# Patient Record
Sex: Female | Born: 1995 | Race: White | Hispanic: No | Marital: Single | Smoking: Former smoker
Health system: Southern US, Community
[De-identification: ages and names within clinical notes are randomized; demographics above are authoritative.]

## PROBLEM LIST (undated history)

## (undated) DIAGNOSIS — F419 Anxiety disorder, unspecified: Secondary | ICD-10-CM

## (undated) DIAGNOSIS — F329 Major depressive disorder, single episode, unspecified: Secondary | ICD-10-CM

## (undated) DIAGNOSIS — F32A Depression, unspecified: Secondary | ICD-10-CM

## (undated) DIAGNOSIS — F988 Other specified behavioral and emotional disorders with onset usually occurring in childhood and adolescence: Secondary | ICD-10-CM

## (undated) DIAGNOSIS — F1020 Alcohol dependence, uncomplicated: Secondary | ICD-10-CM

## (undated) HISTORY — DX: Other specified behavioral and emotional disorders with onset usually occurring in childhood and adolescence: F98.8

## (undated) HISTORY — PX: WISDOM TOOTH EXTRACTION: SHX21

## (undated) HISTORY — DX: Alcohol dependence, uncomplicated: F10.20

## (undated) HISTORY — DX: Depression, unspecified: F32.A

## (undated) HISTORY — DX: Anxiety disorder, unspecified: F41.9

---
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
## 2016-12-27 DIAGNOSIS — F331 Major depressive disorder, recurrent, moderate: Secondary | ICD-10-CM | POA: Diagnosis not present

## 2017-02-21 DIAGNOSIS — F331 Major depressive disorder, recurrent, moderate: Secondary | ICD-10-CM | POA: Diagnosis not present

## 2017-03-07 DIAGNOSIS — F331 Major depressive disorder, recurrent, moderate: Secondary | ICD-10-CM | POA: Diagnosis not present

## 2017-03-21 DIAGNOSIS — F331 Major depressive disorder, recurrent, moderate: Secondary | ICD-10-CM | POA: Diagnosis not present

## 2017-04-04 DIAGNOSIS — F331 Major depressive disorder, recurrent, moderate: Secondary | ICD-10-CM | POA: Diagnosis not present

## 2017-04-18 DIAGNOSIS — F331 Major depressive disorder, recurrent, moderate: Secondary | ICD-10-CM | POA: Diagnosis not present

## 2017-05-03 DIAGNOSIS — F331 Major depressive disorder, recurrent, moderate: Secondary | ICD-10-CM | POA: Diagnosis not present

## 2017-05-16 DIAGNOSIS — F331 Major depressive disorder, recurrent, moderate: Secondary | ICD-10-CM | POA: Diagnosis not present

## 2017-06-27 DIAGNOSIS — F331 Major depressive disorder, recurrent, moderate: Secondary | ICD-10-CM | POA: Diagnosis not present

## 2017-07-11 DIAGNOSIS — F331 Major depressive disorder, recurrent, moderate: Secondary | ICD-10-CM | POA: Diagnosis not present

## 2017-07-11 DIAGNOSIS — F419 Anxiety disorder, unspecified: Secondary | ICD-10-CM | POA: Diagnosis not present

## 2017-07-11 DIAGNOSIS — F3281 Premenstrual dysphoric disorder: Secondary | ICD-10-CM | POA: Diagnosis not present

## 2017-07-18 DIAGNOSIS — F331 Major depressive disorder, recurrent, moderate: Secondary | ICD-10-CM | POA: Diagnosis not present

## 2017-07-25 DIAGNOSIS — F419 Anxiety disorder, unspecified: Secondary | ICD-10-CM | POA: Diagnosis not present

## 2017-08-01 DIAGNOSIS — F419 Anxiety disorder, unspecified: Secondary | ICD-10-CM | POA: Diagnosis not present

## 2017-08-08 DIAGNOSIS — F419 Anxiety disorder, unspecified: Secondary | ICD-10-CM | POA: Diagnosis not present

## 2017-08-15 DIAGNOSIS — F419 Anxiety disorder, unspecified: Secondary | ICD-10-CM | POA: Diagnosis not present

## 2017-08-29 DIAGNOSIS — F419 Anxiety disorder, unspecified: Secondary | ICD-10-CM | POA: Diagnosis not present

## 2017-09-05 DIAGNOSIS — F419 Anxiety disorder, unspecified: Secondary | ICD-10-CM | POA: Diagnosis not present

## 2017-09-26 DIAGNOSIS — F419 Anxiety disorder, unspecified: Secondary | ICD-10-CM | POA: Diagnosis not present

## 2017-10-30 DIAGNOSIS — F3281 Premenstrual dysphoric disorder: Secondary | ICD-10-CM | POA: Diagnosis not present

## 2017-10-30 DIAGNOSIS — F331 Major depressive disorder, recurrent, moderate: Secondary | ICD-10-CM | POA: Diagnosis not present

## 2017-10-30 DIAGNOSIS — G478 Other sleep disorders: Secondary | ICD-10-CM | POA: Diagnosis not present

## 2017-10-31 DIAGNOSIS — F419 Anxiety disorder, unspecified: Secondary | ICD-10-CM | POA: Diagnosis not present

## 2018-04-24 DIAGNOSIS — F3281 Premenstrual dysphoric disorder: Secondary | ICD-10-CM | POA: Diagnosis not present

## 2018-04-24 DIAGNOSIS — F331 Major depressive disorder, recurrent, moderate: Secondary | ICD-10-CM | POA: Diagnosis not present

## 2018-04-24 DIAGNOSIS — G4700 Insomnia, unspecified: Secondary | ICD-10-CM | POA: Diagnosis not present

## 2018-09-11 DIAGNOSIS — F331 Major depressive disorder, recurrent, moderate: Secondary | ICD-10-CM | POA: Diagnosis not present

## 2018-10-07 DIAGNOSIS — F331 Major depressive disorder, recurrent, moderate: Secondary | ICD-10-CM | POA: Diagnosis not present

## 2018-10-22 DIAGNOSIS — F331 Major depressive disorder, recurrent, moderate: Secondary | ICD-10-CM | POA: Diagnosis not present

## 2018-11-03 DIAGNOSIS — F331 Major depressive disorder, recurrent, moderate: Secondary | ICD-10-CM | POA: Diagnosis not present

## 2018-11-05 DIAGNOSIS — F331 Major depressive disorder, recurrent, moderate: Secondary | ICD-10-CM | POA: Diagnosis not present

## 2018-11-12 DIAGNOSIS — F331 Major depressive disorder, recurrent, moderate: Secondary | ICD-10-CM | POA: Diagnosis not present

## 2018-11-17 DIAGNOSIS — F331 Major depressive disorder, recurrent, moderate: Secondary | ICD-10-CM | POA: Diagnosis not present

## 2018-11-20 DIAGNOSIS — F331 Major depressive disorder, recurrent, moderate: Secondary | ICD-10-CM | POA: Diagnosis not present

## 2018-11-20 NOTE — Progress Notes (Signed)
Subjective:    Patient ID: Rhonda Benton, female    DOB: 24-Oct-1995, 23 y.o.   MRN: [id number]  HPI  She is here to establish with a new pcp.  She is here for a physical exam.   She recently moved back to the area and is currently living with her parents.  She moved after breaking up with her boyfriend.  She is experiencing significant anxiety and depression.  She is following with a therapist and psychiatrist.  Her medication is being adjusted and her Effexor dose was recently increased.  She is experiencing headaches.  She experienced headaches when the medication was first started, but they improved and the medication was increased again a couple of days ago and she is having headaches.  Ibuprofen does help.  She is trying to determine what she is going to do with her life at this point.  Her mother, maternal aunt and maternal uncle all have bicuspid aortic valve.  She has not been evaluated for this and feels she should get evaluated for it.     Medications and allergies reviewed with patient and updated if appropriate.  Patient Active Problem List   Diagnosis Date Noted  . KNEE PAIN, BILATERAL 05/20/2007  . PES PLANUS 05/20/2007    Current Outpatient Medications on File Prior to Visit  Medication Sig Dispense Refill  . clonazePAM (KLONOPIN) 0.5 MG tablet Take 1 tablet by mouth as needed.     No current facility-administered medications on file prior to visit.     Past Medical History:  Diagnosis Date  . Anxiety   . Depression     Past Surgical History:  Procedure Laterality Date  . WISDOM TOOTH EXTRACTION      Social History   Socioeconomic History  . Marital status: Single    Spouse name: Not on file  . Number of children: Not on file  . Years of education: Not on file  . Highest education level: Not on file  Occupational History  . Not on file  Social Needs  . Financial resource strain: Not on file  . Food insecurity    Worry: Not on file   Inability: Not on file  . Transportation needs    Medical: Not on file    Non-medical: Not on file  Tobacco Use  . Smoking status: Former Smoker    Types: Cigarettes  . Smokeless tobacco: Never Used  Substance and Sexual Activity  . Alcohol use: Yes  . Drug use: Yes    Types: Marijuana  . Sexual activity: Not on file  Lifestyle  . Physical activity    Days per week: Not on file    Minutes per session: Not on file  . Stress: Not on file  Relationships  . Social Musician on phone: Not on file    Gets together: Not on file    Attends religious service: Not on file    Active member of club or organization: Not on file    Attends meetings of clubs or organizations: Not on file    Relationship status: Not on file  Other Topics Concern  . Not on file  Social History Narrative  . Not on file    Family History  Problem Relation Age of Onset  . Diabetes Other   . High Cholesterol Mother   . Crohn's disease Mother   . Prostate cancer Father   . Colitis Brother   . Breast cancer Maternal Grandmother   .  Kidney disease Maternal Grandfather   . Skin cancer Maternal Grandfather   . Hearing loss Paternal Grandmother   . Heart attack Paternal Grandmother   . Prostate cancer Paternal Grandfather   . Diabetes Paternal Grandfather   . Hearing loss Paternal Grandfather   . Heart disease Maternal Aunt   . Heart disease Maternal Uncle     Review of Systems  Constitutional: Negative for chills and fever.  Eyes: Negative for visual disturbance.  Respiratory: Positive for cough (allergy related). Negative for shortness of breath and wheezing.   Cardiovascular: Positive for palpitations (anxiety related). Negative for chest pain.  Gastrointestinal: Positive for constipation (mild) and nausea (anxiety related). Negative for abdominal pain, blood in stool and diarrhea.       Rare gerd  Genitourinary: Negative for dysuria and hematuria.  Musculoskeletal: Positive for back  pain (l lower back to l hip). Negative for arthralgias.  Skin: Negative for color change and rash.  Neurological: Positive for headaches (with effexor). Negative for dizziness and light-headedness.  Psychiatric/Behavioral: Positive for dysphoric mood and sleep disturbance. The patient is nervous/anxious.        Objective:   Vitals:   11/21/18 1353  BP: 140/80  Pulse: 91  Temp: 98.3 F (36.8 C)  SpO2: 98%   Filed Weights   11/21/18 1353  Weight: 155 lb (70.3 kg)   Body mass index is 26.19 kg/m.  BP Readings from Last 3 Encounters:  11/21/18 140/80  10/11/11 (!) 98/54    Wt Readings from Last 3 Encounters:  11/21/18 155 lb (70.3 kg)     Physical Exam Constitutional: She appears well-developed and well-nourished. No distress.  HENT:  Head: Normocephalic and atraumatic.  Right Ear: External ear normal. Normal ear canal and TM Left Ear: External ear normal.  Normal ear canal and TM Mouth/Throat: Oropharynx is clear and moist.  Eyes: Conjunctivae and EOM are normal.  Neck: Neck supple. No tracheal deviation present. No thyromegaly present.  No carotid bruit  Cardiovascular: Normal rate, regular rhythm and normal heart sounds.   No murmur heard.  No edema. Pulmonary/Chest: Effort normal and breath sounds normal. No respiratory distress. She has no wheezes. She has no rales.  Breast: deferred   Abdominal: Soft. She exhibits no distension. There is no tenderness.  Lymphadenopathy: She has no cervical adenopathy.  Skin: Skin is warm and dry. She is not diaphoretic.  Psychiatric: She has a depressed and anxious mood and affect. Her behavior is normal.        Assessment & Plan:   Physical exam: Screening blood work    ordered Immunizations  Flu vaccine today, tdap probably up to date Gyn   Due - will schedule Exercise  Minimal - but active Weight  Normal BMI Substance abuse   none  See Problem List for Assessment and Plan of chronic medical problems.  Follow-up  in 1 year, sooner if needed

## 2018-11-21 ENCOUNTER — Other Ambulatory Visit (INDEPENDENT_AMBULATORY_CARE_PROVIDER_SITE_OTHER): Payer: BC Managed Care – PPO

## 2018-11-21 ENCOUNTER — Encounter: Payer: Self-pay | Admitting: Internal Medicine

## 2018-11-21 ENCOUNTER — Ambulatory Visit (INDEPENDENT_AMBULATORY_CARE_PROVIDER_SITE_OTHER): Payer: BC Managed Care – PPO | Admitting: Internal Medicine

## 2018-11-21 ENCOUNTER — Other Ambulatory Visit: Payer: Self-pay

## 2018-11-21 VITALS — BP 140/80 | HR 91 | Temp 98.3°F | Ht 64.5 in | Wt 155.0 lb

## 2018-11-21 DIAGNOSIS — Z8279 Family history of other congenital malformations, deformations and chromosomal abnormalities: Secondary | ICD-10-CM | POA: Diagnosis not present

## 2018-11-21 DIAGNOSIS — Z23 Encounter for immunization: Secondary | ICD-10-CM

## 2018-11-21 DIAGNOSIS — Z0000 Encounter for general adult medical examination without abnormal findings: Secondary | ICD-10-CM | POA: Diagnosis not present

## 2018-11-21 DIAGNOSIS — F4323 Adjustment disorder with mixed anxiety and depressed mood: Secondary | ICD-10-CM

## 2018-11-21 LAB — COMPREHENSIVE METABOLIC PANEL
ALT: 15 U/L (ref 0–35)
AST: 16 U/L (ref 0–37)
Albumin: 4.7 g/dL (ref 3.5–5.2)
Alkaline Phosphatase: 71 U/L (ref 39–117)
BUN: 9 mg/dL (ref 6–23)
CO2: 26 mEq/L (ref 19–32)
Calcium: 10.1 mg/dL (ref 8.4–10.5)
Chloride: 99 mEq/L (ref 96–112)
Creatinine, Ser: 0.68 mg/dL (ref 0.40–1.20)
GFR: 105.16 mL/min (ref >60.00)
Glucose, Bld: 96 mg/dL (ref 70–99)
Potassium: 3.6 mEq/L (ref 3.5–5.1)
Sodium: 137 mEq/L (ref 135–145)
Total Bilirubin: 0.5 mg/dL (ref 0.2–1.2)
Total Protein: 8.1 g/dL (ref 6.0–8.3)

## 2018-11-21 LAB — CBC WITH DIFFERENTIAL/PLATELET
Basophils Absolute: 0 10*3/uL (ref 0.0–0.1)
Basophils Relative: 0.3 % (ref 0.0–3.0)
Eosinophils Absolute: 0 10*3/uL (ref 0.0–0.7)
Eosinophils Relative: 0.4 % (ref 0.0–5.0)
HCT: 40.2 % (ref 36.0–46.0)
Hemoglobin: 13.6 g/dL (ref 12.0–15.0)
Lymphocytes Relative: 17.4 % (ref 12.0–46.0)
Lymphs Abs: 1.3 10*3/uL (ref 0.7–4.0)
MCHC: 33.9 g/dL (ref 30.0–36.0)
MCV: 84.1 fl (ref 78.0–100.0)
Monocytes Absolute: 0.7 10*3/uL (ref 0.1–1.0)
Monocytes Relative: 9 % (ref 3.0–12.0)
Neutro Abs: 5.6 10*3/uL (ref 1.4–7.7)
Neutrophils Relative %: 72.9 % (ref 43.0–77.0)
Platelets: 226 10*3/uL (ref 150.0–400.0)
RBC: 4.78 Mil/uL (ref 3.87–5.11)
RDW: 14.3 % (ref 11.5–15.5)
WBC: 7.7 10*3/uL (ref 4.0–10.5)

## 2018-11-21 LAB — LIPID PANEL
Cholesterol: 188 mg/dL (ref 0–200)
HDL: 64 mg/dL (ref >39.00)
LDL Cholesterol: 104 mg/dL — ABNORMAL HIGH (ref 0–99)
NonHDL: 124.43
Total CHOL/HDL Ratio: 3
Triglycerides: 103 mg/dL (ref 0.0–149.0)
VLDL: 20.6 mg/dL (ref 0.0–40.0)

## 2018-11-21 LAB — TSH: TSH: 1.66 u[IU]/mL (ref 0.35–4.50)

## 2018-11-21 MED ORDER — VENLAFAXINE HCL ER 150 MG PO CP24: 150.0000 mg | ORAL_CAPSULE | Freq: Every day | ORAL | Status: AC

## 2018-11-21 NOTE — Patient Instructions (Addendum)
[facility] [address] [place], [place] [address] [phone]   [facility] [address] [place], [place] Across the street from [facility] [phone]    Tests ordered today. Your results will be released to [place] (or called to you) after review.  If any changes need to be made, you will be notified at that same time.  All other Health Maintenance issues reviewed.   All recommended immunizations and age-appropriate screenings are up-to-date or discussed.  Flu immunization administered today.  .   Medications reviewed and updated.  Changes include :   none    [facility] [place]  [facility] [place]  [place], [place]  [facility] [place] [facility] [place], [place], [place], [phone] Office [phone] [facility]            [place], [place] Maintenance, Female Adopting a healthy lifestyle and getting preventive care are important in promoting health and wellness. Ask your health care provider about:  The right schedule for you to have regular tests and exams.  Things you can do on your own to prevent diseases and keep yourself healthy. What should I know about diet, weight, and exercise? Eat a healthy diet   Eat a diet that includes plenty of vegetables, fruits, low-fat dairy products, and lean protein.  Do not eat a lot of foods that are high in solid fats, added sugars, or sodium. Maintain a healthy weight Body mass index (BMI) is used to identify weight problems. It estimates body fat  based on height and weight. Your health care provider can help determine your BMI and help you achieve or maintain a healthy weight. Get regular exercise Get regular exercise. This is one of the most important things you can do for your health. Most adults should:  Exercise for at least 150 minutes each week. The exercise should increase your heart rate and make you sweat (moderate-intensity exercise).  Do strengthening exercises at least twice a week. This is in addition to the moderate-intensity exercise.  Spend less time sitting. Even light physical activity can be beneficial. Watch cholesterol and blood lipids Have your blood tested for lipids and cholesterol at 25 years of age, then have this test every 5 years. Have your cholesterol levels checked more often if:  Your lipid or cholesterol levels are high.  You are older than 25 years of age.  You are at high risk for heart disease. What should I know about cancer screening? Depending on your health history and family history, you may need to have cancer screening at various ages. This may include screening for:  Breast cancer.  Cervical cancer.  Colorectal cancer.  Skin cancer.  Lung cancer. What should I know about heart disease, diabetes, and high blood pressure? Blood pressure and heart disease  High blood pressure causes heart disease and increases the risk of stroke. This is more likely to develop in people who have high blood pressure readings, are of African descent, or are overweight.  Have your blood pressure checked: ? Every 3-5 years if you are 5-27 years of age. ? Every year if you are 60  years old or older. Diabetes Have regular diabetes screenings. This checks your fasting blood sugar level. Have the screening done:  Once every three years after age 57 if you are at a normal weight and have a low risk for diabetes.  More often and at a younger age if you are overweight or have a high risk for diabetes.  What should I know about preventing infection? Hepatitis B If you have a higher risk for hepatitis B, you should be screened for this virus. Talk with your health care provider to find out if you are at risk for hepatitis B infection. Hepatitis C Testing is recommended for:  Everyone born from [birth year] through 1967.  Anyone with known risk factors for hepatitis C. Sexually transmitted infections (STIs)  Get screened for STIs, including gonorrhea and chlamydia, if: ? You are sexually active and are younger than 25 years of age. ? You are older than 25 years of age and your health care provider tells you that you are at risk for this type of infection. ? Your sexual activity has changed since you were last screened, and you are at increased risk for chlamydia or gonorrhea. Ask your health care provider if you are at risk.  Ask your health care provider about whether you are at high risk for HIV. Your health care provider may recommend a prescription medicine to help prevent HIV infection. If you choose to take medicine to prevent HIV, you should first get tested for HIV. You should then be tested every 3 months for as long as you are taking the medicine. Pregnancy  If you are about to stop having your period (premenopausal) and you may become pregnant, seek counseling before you get pregnant.  Take 400 to 800 micrograms (mcg) of folic acid every day if you become pregnant.  Ask for birth control (contraception) if you want to prevent pregnancy. Osteoporosis and menopause Osteoporosis is a disease in which the bones lose minerals and strength with aging. This can result in bone fractures. If you are 1 years old or older, or if you are at risk for osteoporosis and fractures, ask your health care provider if you should:  Be screened for bone loss.  Take a calcium or vitamin D supplement to lower your risk of fractures.  Be given hormone replacement therapy (HRT) to treat symptoms of  menopause. Follow these instructions at home: Lifestyle  Do not use any products that contain nicotine or tobacco, such as cigarettes, e-cigarettes, and chewing tobacco. If you need help quitting, ask your health care provider.  Do not use street drugs.  Do not share needles.  Ask your health care provider for help if you need support or information about quitting drugs. Alcohol use  Do not drink alcohol if: ? Your health care provider tells you not to drink. ? You are pregnant, may be pregnant, or are planning to become pregnant.  If you drink alcohol: ? Limit how much you use to 0-1 drink a day. ? Limit intake if you are breastfeeding.  Be aware of how much alcohol is in your drink. In the [place] one drink equals one 12 oz bottle of beer (355 mL), one 5 oz glass of wine (148 mL), or one 1 oz glass of hard liquor (44 mL). General instructions  Schedule regular health, dental, and eye exams.  Stay current with your vaccines.  Tell your health care provider if: ? You often feel depressed. ? You have ever been abused or  do not feel safe at home. Summary  Adopting a healthy lifestyle and getting preventive care are important in promoting health and wellness.  Follow your health care provider's instructions about healthy diet, exercising, and getting tested or screened for diseases.  Follow your health care provider's instructions on monitoring your cholesterol and blood pressure. This information is not intended to replace advice given to you by your health care provider. Make sure you discuss any questions you have with your health care provider. Document Released: 08/21/2010 Document Revised: 01/29/2018 Document Reviewed: 01/29/2018 Elsevier Patient Education  2020 [employer].

## 2018-11-21 NOTE — Assessment & Plan Note (Signed)
Significant family history of bicuspid aortic valves, mom has had valve replacement because of this We will order an echocardiogram to evaluate valve

## 2018-11-21 NOTE — Assessment & Plan Note (Signed)
Related to a recent break-up Following with psychiatry-taking Effexor, clonazepam Also seeing a therapist, but may try to find a new one since she has not connected with her current therapist

## 2018-11-22 ENCOUNTER — Encounter: Payer: Self-pay | Admitting: Internal Medicine

## 2018-11-28 ENCOUNTER — Encounter (HOSPITAL_COMMUNITY): Payer: Self-pay | Admitting: Internal Medicine

## 2018-11-29 DIAGNOSIS — F331 Major depressive disorder, recurrent, moderate: Secondary | ICD-10-CM | POA: Diagnosis not present

## 2018-12-09 DIAGNOSIS — F331 Major depressive disorder, recurrent, moderate: Secondary | ICD-10-CM | POA: Diagnosis not present

## 2018-12-11 ENCOUNTER — Telehealth (HOSPITAL_COMMUNITY): Payer: Self-pay

## 2018-12-11 NOTE — Telephone Encounter (Signed)
New message   Just an FYI. We have made several attempts to contact this patient including sending a letter to schedule or reschedule their echocardiogram. We will be removing the patient from the echo WQ.   10.22.20 @ 2:35pm lm on home vm - Rhonda Benton  10.9.20 mail reminder letter Rhonda Benton  10.6.20 2 12:24pm lm on home vm  - Rhonda Benton

## 2018-12-15 DIAGNOSIS — F331 Major depressive disorder, recurrent, moderate: Secondary | ICD-10-CM | POA: Diagnosis not present

## 2018-12-18 DIAGNOSIS — F331 Major depressive disorder, recurrent, moderate: Secondary | ICD-10-CM | POA: Diagnosis not present

## 2018-12-23 DIAGNOSIS — Z0100 Encounter for examination of eyes and vision without abnormal findings: Secondary | ICD-10-CM | POA: Diagnosis not present

## 2018-12-24 DIAGNOSIS — F331 Major depressive disorder, recurrent, moderate: Secondary | ICD-10-CM | POA: Diagnosis not present

## 2018-12-25 DIAGNOSIS — L700 Acne vulgaris: Secondary | ICD-10-CM | POA: Diagnosis not present

## 2018-12-29 ENCOUNTER — Other Ambulatory Visit (HOSPITAL_COMMUNITY): Payer: BC Managed Care – PPO

## 2018-12-29 ENCOUNTER — Encounter: Payer: Self-pay | Admitting: Internal Medicine

## 2018-12-29 DIAGNOSIS — J029 Acute pharyngitis, unspecified: Secondary | ICD-10-CM | POA: Diagnosis not present

## 2018-12-29 DIAGNOSIS — J020 Streptococcal pharyngitis: Secondary | ICD-10-CM | POA: Diagnosis not present

## 2018-12-29 DIAGNOSIS — Z299 Encounter for prophylactic measures, unspecified: Secondary | ICD-10-CM | POA: Diagnosis not present

## 2019-01-12 ENCOUNTER — Telehealth (HOSPITAL_COMMUNITY): Payer: Self-pay

## 2019-01-12 ENCOUNTER — Other Ambulatory Visit (HOSPITAL_COMMUNITY): Payer: BC Managed Care – PPO

## 2019-01-22 DIAGNOSIS — F331 Major depressive disorder, recurrent, moderate: Secondary | ICD-10-CM | POA: Diagnosis not present

## 2019-01-28 DIAGNOSIS — F331 Major depressive disorder, recurrent, moderate: Secondary | ICD-10-CM | POA: Diagnosis not present

## 2019-01-29 DIAGNOSIS — H5231 Anisometropia: Secondary | ICD-10-CM | POA: Diagnosis not present

## 2019-01-29 DIAGNOSIS — H5702 Anisocoria: Secondary | ICD-10-CM | POA: Diagnosis not present

## 2019-01-29 DIAGNOSIS — H538 Other visual disturbances: Secondary | ICD-10-CM | POA: Diagnosis not present

## 2019-02-04 DIAGNOSIS — F331 Major depressive disorder, recurrent, moderate: Secondary | ICD-10-CM | POA: Diagnosis not present

## 2019-02-05 DIAGNOSIS — Z6826 Body mass index (BMI) 26.0-26.9, adult: Secondary | ICD-10-CM | POA: Diagnosis not present

## 2019-02-05 DIAGNOSIS — Z130 Encounter for screening for diseases of the blood and blood-forming organs and certain disorders involving the immune mechanism: Secondary | ICD-10-CM | POA: Diagnosis not present

## 2019-02-05 DIAGNOSIS — Z01419 Encounter for gynecological examination (general) (routine) without abnormal findings: Secondary | ICD-10-CM | POA: Diagnosis not present

## 2019-02-05 DIAGNOSIS — Z113 Encounter for screening for infections with a predominantly sexual mode of transmission: Secondary | ICD-10-CM | POA: Diagnosis not present

## 2019-02-05 DIAGNOSIS — Z124 Encounter for screening for malignant neoplasm of cervix: Secondary | ICD-10-CM | POA: Diagnosis not present

## 2019-02-10 DIAGNOSIS — F331 Major depressive disorder, recurrent, moderate: Secondary | ICD-10-CM | POA: Diagnosis not present

## 2019-02-11 NOTE — Telephone Encounter (Signed)
err

## 2019-02-17 DIAGNOSIS — F331 Major depressive disorder, recurrent, moderate: Secondary | ICD-10-CM | POA: Diagnosis not present

## 2019-02-25 DIAGNOSIS — F331 Major depressive disorder, recurrent, moderate: Secondary | ICD-10-CM | POA: Diagnosis not present

## 2019-03-06 DIAGNOSIS — F331 Major depressive disorder, recurrent, moderate: Secondary | ICD-10-CM | POA: Diagnosis not present

## 2019-03-12 DIAGNOSIS — F331 Major depressive disorder, recurrent, moderate: Secondary | ICD-10-CM | POA: Diagnosis not present

## 2019-03-19 DIAGNOSIS — F331 Major depressive disorder, recurrent, moderate: Secondary | ICD-10-CM | POA: Diagnosis not present

## 2019-03-25 DIAGNOSIS — F331 Major depressive disorder, recurrent, moderate: Secondary | ICD-10-CM | POA: Diagnosis not present

## 2019-04-03 DIAGNOSIS — F331 Major depressive disorder, recurrent, moderate: Secondary | ICD-10-CM | POA: Diagnosis not present

## 2019-04-08 DIAGNOSIS — F331 Major depressive disorder, recurrent, moderate: Secondary | ICD-10-CM | POA: Diagnosis not present

## 2019-04-30 DIAGNOSIS — F331 Major depressive disorder, recurrent, moderate: Secondary | ICD-10-CM | POA: Diagnosis not present

## 2019-05-07 DIAGNOSIS — F331 Major depressive disorder, recurrent, moderate: Secondary | ICD-10-CM | POA: Diagnosis not present

## 2019-05-14 DIAGNOSIS — F331 Major depressive disorder, recurrent, moderate: Secondary | ICD-10-CM | POA: Diagnosis not present

## 2019-05-20 DIAGNOSIS — F331 Major depressive disorder, recurrent, moderate: Secondary | ICD-10-CM | POA: Diagnosis not present

## 2019-06-09 DIAGNOSIS — F331 Major depressive disorder, recurrent, moderate: Secondary | ICD-10-CM | POA: Diagnosis not present

## 2019-06-18 DIAGNOSIS — F331 Major depressive disorder, recurrent, moderate: Secondary | ICD-10-CM | POA: Diagnosis not present

## 2019-06-22 DIAGNOSIS — F331 Major depressive disorder, recurrent, moderate: Secondary | ICD-10-CM | POA: Diagnosis not present

## 2019-11-03 DIAGNOSIS — F411 Generalized anxiety disorder: Secondary | ICD-10-CM | POA: Diagnosis not present

## 2019-11-05 ENCOUNTER — Telehealth (HOSPITAL_COMMUNITY): Payer: Self-pay | Admitting: Licensed Clinical Social Worker

## 2019-11-09 DIAGNOSIS — F331 Major depressive disorder, recurrent, moderate: Secondary | ICD-10-CM | POA: Diagnosis not present

## 2019-11-09 DIAGNOSIS — F413 Other mixed anxiety disorders: Secondary | ICD-10-CM | POA: Diagnosis not present

## 2019-11-09 NOTE — Telephone Encounter (Signed)
See phone call logs. Attempted outreach for CDIOP unsuccessful

## 2019-11-14 DIAGNOSIS — F411 Generalized anxiety disorder: Secondary | ICD-10-CM | POA: Diagnosis not present

## 2019-11-24 DIAGNOSIS — F411 Generalized anxiety disorder: Secondary | ICD-10-CM | POA: Diagnosis not present

## 2020-04-13 DIAGNOSIS — N760 Acute vaginitis: Secondary | ICD-10-CM | POA: Diagnosis not present

## 2020-04-13 DIAGNOSIS — Z130 Encounter for screening for diseases of the blood and blood-forming organs and certain disorders involving the immune mechanism: Secondary | ICD-10-CM | POA: Diagnosis not present

## 2020-04-13 DIAGNOSIS — N898 Other specified noninflammatory disorders of vagina: Secondary | ICD-10-CM | POA: Diagnosis not present

## 2020-04-13 DIAGNOSIS — Z01419 Encounter for gynecological examination (general) (routine) without abnormal findings: Secondary | ICD-10-CM | POA: Diagnosis not present

## 2020-04-13 DIAGNOSIS — Z6824 Body mass index (BMI) 24.0-24.9, adult: Secondary | ICD-10-CM | POA: Diagnosis not present

## 2020-04-19 ENCOUNTER — Other Ambulatory Visit: Payer: Self-pay

## 2020-04-19 NOTE — Patient Instructions (Signed)
Blood work was ordered.     No immunization administered today.   Medications changes include :   none   An Echo was ordered.  Someone will call you to schedule an appointment.    Please followup in 1 year     Health Maintenance, Female Adopting a healthy lifestyle and getting preventive care are important in promoting health and wellness. Ask your health care provider about:  The right schedule for you to have regular tests and exams.  Things you can do on your own to prevent diseases and keep yourself healthy. What should I know about diet, weight, and exercise? Eat a healthy diet  Eat a diet that includes plenty of vegetables, fruits, low-fat dairy products, and lean protein.  Do not eat a lot of foods that are high in solid fats, added sugars, or sodium.   Maintain a healthy weight Body mass index (BMI) is used to identify weight problems. It estimates body fat based on height and weight. Your health care provider can help determine your BMI and help you achieve or maintain a healthy weight. Get regular exercise Get regular exercise. This is one of the most important things you can do for your health. Most adults should:  Exercise for at least 150 minutes each week. The exercise should increase your heart rate and make you sweat (moderate-intensity exercise).  Do strengthening exercises at least twice a week. This is in addition to the moderate-intensity exercise.  Spend less time sitting. Even light physical activity can be beneficial. Watch cholesterol and blood lipids Have your blood tested for lipids and cholesterol at 27 years of age, then have this test every 5 years. Have your cholesterol levels checked more often if:  Your lipid or cholesterol levels are high.  You are older than 27 years of age.  You are at high risk for heart disease. What should I know about cancer screening? Depending on your health history and family history, you may need to have  cancer screening at various ages. This may include screening for:  Breast cancer.  Cervical cancer.  Colorectal cancer.  Skin cancer.  Lung cancer. What should I know about heart disease, diabetes, and high blood pressure? Blood pressure and heart disease  High blood pressure causes heart disease and increases the risk of stroke. This is more likely to develop in people who have high blood pressure readings, are of African descent, or are overweight.  Have your blood pressure checked: ? Every 3-5 years if you are 13-66 years of age. ? Every year if you are 35 years old or older. Diabetes Have regular diabetes screenings. This checks your fasting blood sugar level. Have the screening done:  Once every three years after age 34 if you are at a normal weight and have a low risk for diabetes.  More often and at a younger age if you are overweight or have a high risk for diabetes. What should I know about preventing infection? Hepatitis B If you have a higher risk for hepatitis B, you should be screened for this virus. Talk with your health care provider to find out if you are at risk for hepatitis B infection. Hepatitis C Testing is recommended for:  Everyone born from [birth year] through 1967.  Anyone with known risk factors for hepatitis C. Sexually transmitted infections (STIs)  Get screened for STIs, including gonorrhea and chlamydia, if: ? You are sexually active and are younger than 27 years of age. ? You are older  than 27 years of age and your health care provider tells you that you are at risk for this type of infection. ? Your sexual activity has changed since you were last screened, and you are at increased risk for chlamydia or gonorrhea. Ask your health care provider if you are at risk.  Ask your health care provider about whether you are at high risk for HIV. Your health care provider may recommend a prescription medicine to help prevent HIV infection. If you choose to take  medicine to prevent HIV, you should first get tested for HIV. You should then be tested every 3 months for as long as you are taking the medicine. Pregnancy  If you are about to stop having your period (premenopausal) and you may become pregnant, seek counseling before you get pregnant.  Take 400 to 800 micrograms (mcg) of folic acid every day if you become pregnant.  Ask for birth control (contraception) if you want to prevent pregnancy. Osteoporosis and menopause Osteoporosis is a disease in which the bones lose minerals and strength with aging. This can result in bone fractures. If you are 6 years old or older, or if you are at risk for osteoporosis and fractures, ask your health care provider if you should:  Be screened for bone loss.  Take a calcium or vitamin D supplement to lower your risk of fractures.  Be given hormone replacement therapy (HRT) to treat symptoms of menopause. Follow these instructions at home: Lifestyle  Do not use any products that contain nicotine or tobacco, such as cigarettes, e-cigarettes, and chewing tobacco. If you need help quitting, ask your health care provider.  Do not use street drugs.  Do not share needles.  Ask your health care provider for help if you need support or information about quitting drugs. Alcohol use  Do not drink alcohol if: ? Your health care provider tells you not to drink. ? You are pregnant, may be pregnant, or are planning to become pregnant.  If you drink alcohol: ? Limit how much you use to 0-1 drink a day. ? Limit intake if you are breastfeeding.  Be aware of how much alcohol is in your drink. In the [place] one drink equals one 12 oz bottle of beer (355 mL), one 5 oz glass of wine (148 mL), or one 1 oz glass of hard liquor (44 mL). General instructions  Schedule regular health, dental, and eye exams.  Stay current with your vaccines.  Tell your health care provider if: ? You often feel depressed. ? You have  ever been abused or do not feel safe at home. Summary  Adopting a healthy lifestyle and getting preventive care are important in promoting health and wellness.  Follow your health care provider's instructions about healthy diet, exercising, and getting tested or screened for diseases.  Follow your health care provider's instructions on monitoring your cholesterol and blood pressure. This information is not intended to replace advice given to you by your health care provider. Make sure you discuss any questions you have with your health care provider. Document Revised: 01/29/2018 Document Reviewed: 01/29/2018 Elsevier Patient Education  2021 [employer].

## 2020-04-19 NOTE — Progress Notes (Signed)
Subjective:    Patient ID: Rhonda Benton, female    DOB: [birth year], 25 y.o.   MRN: [id number]   This visit occurred during the SARS-CoV-2 public health emergency.  Safety protocols were in place, including screening questions prior to the visit, additional usage of staff PPE, and extensive cleaning of exam room while observing appropriate contact time as indicated for disinfecting solutions.    HPI She is here for a physical exam.   She saw her gynecologist last week and she did have her hemoglobin checked and it was 1.7.  She just got her menses and figures it may be slightly lower.  This is not unusual.  She typically has heavy menses and will have mild anemia associated with that.  She is currently working on [employer].  She has no major concerns.  Medications and allergies reviewed with patient and updated if appropriate.  Patient Active Problem List   Diagnosis Date Noted  . 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 Family history of first degree relative with bicuspid aortic valve 11/21/2018  . KNEE PAIN, BILATERAL 05/20/2007  . PES PLANUS 05/20/2007    Current Outpatient Medications on File Prior to Visit  Medication Sig Dispense Refill  . metroNIDAZOLE (FLAGYL) 500 MG tablet Take 500 mg by mouth 2 (two) times daily.    Marland Kitchen venlafaxine XR (EFFEXOR-XR) 150 MG 24 hr capsule Take 1 capsule (150 mg total) by mouth daily.     No current facility-administered medications on file prior to visit.    Past Medical History:  Diagnosis Date  . Anxiety   . Depression     Past Surgical History:  Procedure Laterality Date  . WISDOM TOOTH EXTRACTION      Social History   Socioeconomic History  . Marital status: Single    Spouse name: Not on file  . Number of children: Not on file  . Years of education: Not on file  . Highest education level: Not on file  Occupational History  . Not on file  Tobacco Use  . Smoking status: Former Smoker     Types: Cigarettes  . Smokeless tobacco: Never Used  Vaping Use  . Vaping Use: Every day  Substance and Sexual Activity  . Alcohol use: Yes  . Drug use: Yes    Types: Marijuana  . Sexual activity: Not on file  Other Topics Concern  . Not on file  Social History Narrative  . Not on file   Social Determinants of Health   Financial Resource Strain: Not on file  Food Insecurity: Not on file  Transportation Needs: Not on file  Physical Activity: Not on file  Stress: Not on file  Social Connections: Not on file    Family History  Problem Relation Age of Onset  . Diabetes Other   . High Cholesterol Mother   . Crohn's disease Mother   . Prostate cancer Father   . Colitis Brother   . Breast cancer Maternal Grandmother   . Kidney disease Maternal Grandfather   . Skin cancer Maternal Grandfather   . Hearing loss Paternal Grandmother   . Heart attack Paternal Grandmother   . Prostate cancer Paternal Grandfather   . Diabetes Paternal Grandfather   . Hearing loss Paternal Grandfather   . Heart disease Maternal Aunt   . Heart disease Maternal Uncle     Review of Systems  Constitutional: Negative for appetite change, chills and fever.  HENT: Positive for congestion (occ).  Eyes: Negative for visual disturbance.  Respiratory: Negative for cough, shortness of breath and wheezing.   Cardiovascular: Negative for chest pain, palpitations and leg swelling.  Gastrointestinal: Negative for abdominal pain, blood in stool, constipation, diarrhea and nausea.       No gerd  Genitourinary: Negative for dysuria.  Musculoskeletal: Negative for arthralgias and back pain.       No chronic pain - work related pain Tennis elbow on R  Skin: Negative for color change and rash.  Neurological: Negative for light-headedness and headaches.  Psychiatric/Behavioral: Positive for dysphoric mood. Negative for sleep disturbance. The patient is nervous/anxious.        Objective:   Vitals:    04/20/20 1258  BP: 106/70  Pulse: 76  Temp: 98.1 F (36.7 C)  SpO2: 98%   Filed Weights   04/20/20 1258  Weight: 146 lb (66.2 kg)   Body mass index is 24.67 kg/m.  BP Readings from Last 3 Encounters:  04/20/20 106/70  11/21/18 140/80  10/11/11 (!) 98/54    Wt Readings from Last 3 Encounters:  04/20/20 146 lb (66.2 kg)  11/21/18 155 lb (70.3 kg)     Physical Exam Constitutional: She appears well-developed and well-nourished. No distress.  HENT:  Head: Normocephalic and atraumatic.  Right Ear: External ear normal. Normal ear canal and TM Left Ear: External ear normal.  Normal ear canal and TM Mouth/Throat: Oropharynx is clear and moist.  Eyes: Conjunctivae and EOM are normal.  Neck: Neck supple. No tracheal deviation present. No thyromegaly present.  No carotid bruit  Cardiovascular: Normal rate, regular rhythm and normal heart sounds.   No murmur heard.  No edema. Pulmonary/Chest: Effort normal and breath sounds normal. No respiratory distress. She has no wheezes. She has no rales.  Breast: deferred   Abdominal: Soft. She exhibits no distension. There is no tenderness.  Lymphadenopathy: She has no cervical adenopathy.  Skin: Skin is warm and dry. She is not diaphoretic.  Psychiatric: She has a normal mood and affect. Her behavior is normal.        Assessment & Plan:   Physical exam: Screening blood work    ordered Immunizations   ?  Last tetanus-deferred for today, deferred flu, had COVID Gyn  Up to date - Rhonda Benton Exercise very active at [employer] in [employer], active outside of work Weight normal Substance abuse  None - quit alcohol completely - did drink too much       See Problem List for Assessment and Plan of chronic medical problems.

## 2020-04-20 ENCOUNTER — Other Ambulatory Visit: Payer: Self-pay

## 2020-04-20 ENCOUNTER — Encounter: Payer: Self-pay | Admitting: Internal Medicine

## 2020-04-20 ENCOUNTER — Ambulatory Visit (INDEPENDENT_AMBULATORY_CARE_PROVIDER_SITE_OTHER): Payer: BC Managed Care – PPO | Admitting: Internal Medicine

## 2020-04-20 VITALS — BP 106/70 | HR 76 | Temp 98.1°F | Ht 64.5 in | Wt 146.0 lb

## 2020-04-20 DIAGNOSIS — Z8279 Family history of other congenital malformations, deformations and chromosomal abnormalities: Secondary | ICD-10-CM

## 2020-04-20 DIAGNOSIS — Z0000 Encounter for general adult medical examination without abnormal findings: Secondary | ICD-10-CM

## 2020-04-20 DIAGNOSIS — F4323 Adjustment disorder with mixed anxiety and depressed mood: Secondary | ICD-10-CM

## 2020-04-20 LAB — COMPREHENSIVE METABOLIC PANEL
ALT: 13 U/L (ref 0–35)
AST: 15 U/L (ref 0–37)
Albumin: 4.2 g/dL (ref 3.5–5.2)
Alkaline Phosphatase: 53 U/L (ref 39–117)
BUN: 9 mg/dL (ref 6–23)
CO2: 26 mEq/L (ref 19–32)
Calcium: 9.1 mg/dL (ref 8.4–10.5)
Chloride: 105 mEq/L (ref 96–112)
Creatinine, Ser: 0.56 mg/dL (ref 0.40–1.20)
GFR: 125.68 mL/min (ref >60.00)
Glucose, Bld: 84 mg/dL (ref 70–99)
Potassium: 3.6 mEq/L (ref 3.5–5.1)
Sodium: 135 mEq/L (ref 135–145)
Total Bilirubin: 0.4 mg/dL (ref 0.2–1.2)
Total Protein: 7 g/dL (ref 6.0–8.3)

## 2020-04-20 LAB — LIPID PANEL
Cholesterol: 129 mg/dL (ref 0–200)
HDL: 51.1 mg/dL (ref >39.00)
LDL Cholesterol: 71 mg/dL (ref 0–99)
NonHDL: 78.03
Total CHOL/HDL Ratio: 3
Triglycerides: 36 mg/dL (ref 0.0–149.0)
VLDL: 7.2 mg/dL (ref 0.0–40.0)

## 2020-04-20 LAB — CBC WITH DIFFERENTIAL/PLATELET
Basophils Absolute: 0 10*3/uL (ref 0.0–0.1)
Basophils Relative: 0.1 % (ref 0.0–3.0)
Eosinophils Absolute: 0 10*3/uL (ref 0.0–0.7)
Eosinophils Relative: 0 % (ref 0.0–5.0)
HCT: 34.4 % — ABNORMAL LOW (ref 36.0–46.0)
Hemoglobin: 11.8 g/dL — ABNORMAL LOW (ref 12.0–15.0)
Lymphocytes Relative: 22.5 % (ref 12.0–46.0)
Lymphs Abs: 1.8 10*3/uL (ref 0.7–4.0)
MCHC: 34.3 g/dL (ref 30.0–36.0)
MCV: 83.4 fl (ref 78.0–100.0)
Monocytes Absolute: 0.6 10*3/uL (ref 0.1–1.0)
Monocytes Relative: 7.2 % (ref 3.0–12.0)
Neutro Abs: 5.7 10*3/uL (ref 1.4–7.7)
Neutrophils Relative %: 70.2 % (ref 43.0–77.0)
Platelets: 204 10*3/uL (ref 150.0–400.0)
RBC: 4.13 Mil/uL (ref 3.87–5.11)
RDW: 14.1 % (ref 11.5–15.5)
WBC: 8.1 10*3/uL (ref 4.0–10.5)

## 2020-04-20 NOTE — Assessment & Plan Note (Signed)
Mom, maternal aunt and maternal uncle all have bicuspid aortic valve She does need to be screened Echocardiogram ordered

## 2020-04-20 NOTE — Assessment & Plan Note (Signed)
Chronic Overall controlled, stable Following with psychiatry Taking Effexor, which is working well Management per psychiatry

## 2020-04-21 LAB — TSH: TSH: 0.86 u[IU]/mL (ref 0.35–4.50)

## 2020-04-27 ENCOUNTER — Encounter (HOSPITAL_COMMUNITY): Payer: Self-pay | Admitting: Internal Medicine

## 2020-05-11 ENCOUNTER — Telehealth (HOSPITAL_COMMUNITY): Payer: Self-pay | Admitting: Internal Medicine

## 2020-05-11 NOTE — Telephone Encounter (Signed)
Just an FYI. We have made several attempts to contact this patient including sending a letter to schedule or reschedule their echocardiogram. We will be removing the patient from the echo WQ.   04/27/20 MAILED LETTER [facility]  04/27/20 [facility] to schedule @ 10:24/[facility]  04/21/20 [facility] to schedule @ 4:11/[facility]         Thank you

## 2020-05-17 ENCOUNTER — Other Ambulatory Visit: Payer: Self-pay

## 2020-05-17 ENCOUNTER — Ambulatory Visit (HOSPITAL_COMMUNITY): Payer: BC Managed Care – PPO | Attending: Internal Medicine

## 2020-05-17 DIAGNOSIS — Z8279 Family history of other congenital malformations, deformations and chromosomal abnormalities: Secondary | ICD-10-CM | POA: Diagnosis not present

## 2020-05-17 LAB — ECHOCARDIOGRAM COMPLETE
Area-P 1/2: 5.13 cm2
S' Lateral: 3.2 cm

## 2020-07-08 DIAGNOSIS — F413 Other mixed anxiety disorders: Secondary | ICD-10-CM | POA: Diagnosis not present

## 2020-07-08 DIAGNOSIS — F331 Major depressive disorder, recurrent, moderate: Secondary | ICD-10-CM | POA: Diagnosis not present

## 2020-09-11 ENCOUNTER — Emergency Department (HOSPITAL_BASED_OUTPATIENT_CLINIC_OR_DEPARTMENT_OTHER)
Admission: EM | Admit: 2020-09-11 | Discharge: 2020-09-11 | Disposition: A | Discharge status: Home / Self Care | Payer: BC Managed Care – PPO | Attending: Emergency Medicine | Admitting: Emergency Medicine

## 2020-09-11 ENCOUNTER — Encounter (HOSPITAL_BASED_OUTPATIENT_CLINIC_OR_DEPARTMENT_OTHER): Payer: Self-pay | Admitting: Obstetrics and Gynecology

## 2020-09-11 ENCOUNTER — Other Ambulatory Visit: Payer: Self-pay

## 2020-09-11 ENCOUNTER — Emergency Department (HOSPITAL_BASED_OUTPATIENT_CLINIC_OR_DEPARTMENT_OTHER): Payer: BC Managed Care – PPO

## 2020-09-11 DIAGNOSIS — Z79899 Other long term (current) drug therapy: Secondary | ICD-10-CM | POA: Insufficient documentation

## 2020-09-11 DIAGNOSIS — Z87891 Personal history of nicotine dependence: Secondary | ICD-10-CM | POA: Diagnosis not present

## 2020-09-11 DIAGNOSIS — S0591XA Unspecified injury of right eye and orbit, initial encounter: Secondary | ICD-10-CM | POA: Diagnosis not present

## 2020-09-11 DIAGNOSIS — S0011XA Contusion of right eyelid and periocular area, initial encounter: Secondary | ICD-10-CM | POA: Diagnosis not present

## 2020-09-11 DIAGNOSIS — S0993XA Unspecified injury of face, initial encounter: Secondary | ICD-10-CM

## 2020-09-11 DIAGNOSIS — Y92410 Unspecified street and highway as the place of occurrence of the external cause: Secondary | ICD-10-CM | POA: Insufficient documentation
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
## 2020-09-11 NOTE — Discharge Instructions (Addendum)
Radiologist could not rule out a hairline fracture of the inferior orbital floor.  Avoid sneezing or blowing your nose as we discussed.  This fracture should heal without any further treatment.  Follow-up with an ENT doctor if you have persistent symptoms or any difficulty with your vision as we discussed
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
## 2020-11-30 ENCOUNTER — Ambulatory Visit: Payer: Self-pay | Admitting: Sports Medicine

## 2020-12-13 ENCOUNTER — Ambulatory Visit (INDEPENDENT_AMBULATORY_CARE_PROVIDER_SITE_OTHER): Payer: 59 | Admitting: Sports Medicine

## 2020-12-13 VITALS — BP 118/70 | Ht 64.0 in | Wt 140.0 lb

## 2020-12-13 DIAGNOSIS — M25511 Pain in right shoulder: Secondary | ICD-10-CM | POA: Diagnosis not present

## 2020-12-13 NOTE — Progress Notes (Addendum)
New Patient Office Visit  Subjective:  Patient ID: Rhonda Benton, adult    DOB: 20-Oct-1995  Age: 25 y.o. MRN: [id number]  CC: right scapula pain  HPI Connye Burkitt is a 25 year old who presents for 3-week history of right subscapular pain associated with a small area of numbness.  Works as a Administrator.  Was pushing a trash can full of mulch over the top of a fence and it came back and fell onto the right hand while it was still above her head. Evaluated at [facility] and was diagnosed with a soft tissue injury and was recommended to seek an heat and ice it along with taking some ibuprofen.  Physical therapy was also ordered but not yet started. Pain is nonradiating, intermittent and burning in nature. No exacerbation with particular movements of the shoulder or back.  Denies association with eating.  No nausea or vomiting.  Past Medical History:  Diagnosis Date   Anxiety    Depression     Past Surgical History:  Procedure Laterality Date   WISDOM TOOTH EXTRACTION      Family History  Problem Relation Age of Onset   Diabetes Other    High Cholesterol Mother    Crohn's disease Mother    Prostate cancer Father    Colitis Brother    Breast cancer Maternal Grandmother    Kidney disease Maternal Grandfather    Skin cancer Maternal Grandfather    Hearing loss Paternal Grandmother    Heart attack Paternal Grandmother    Prostate cancer Paternal Grandfather    Diabetes Paternal Grandfather    Hearing loss Paternal Grandfather    Heart disease Maternal Aunt    Heart disease Maternal Uncle     Social History   Socioeconomic History   Marital status: Single    Spouse name: Not on file   Number of children: Not on file   Years of education: Not on file   Highest education level: Not on file  Occupational History   Not on file  Tobacco Use   Smoking status: Former    Types: Cigarettes   Smokeless tobacco: Never  Vaping Use   Vaping Use: Every day  Substance and Sexual Activity    Alcohol use: Not Currently   Drug use: Yes    Types: Marijuana   Sexual activity: Not on file  Other Topics Concern   Not on file  Social History Narrative   Not on file   Social Determinants of Health   Financial Resource Strain: Not on file  Food Insecurity: Not on file  Transportation Needs: Not on file  Physical Activity: Not on file  Stress: Not on file  Social Connections: Not on file  Intimate Partner Violence: Not on file    ROS Focused review of systems is negative unless stated above in the HPI  Objective:   Today's Vitals: BP 118/70   Ht 5\' 4"  (1.626 m)   Wt 140 lb (63.5 kg)   BMI 24.03 kg/m   General: Well appearing female in no distress MSK: No obvious deformity of the thoracic or cervical spine.  Muscle tightness involving the paraspinal muscles on the right.  Tender to to palpation in the subscapular region that extends superiorly.  She has a small area of decreased sensation approximately 1 cm located just medial to the right inferior scapula.  Range of motion of the right shoulder is intact and does not exacerbate pain.  No scapular winging on exam.  Normal strength, no  pain on empty can test.  Assessment & Plan:    Acute right rhomboid strain Suspect that this is musculoskeletal in nature however we will obtain a two-view scapula x-ray to rule out alternative etiologies.  No evidence of rotator cuff injury. Discussed the above diagnosis and likely treatment course involving rest, physical therapy, and NSAID therapy.  Explained that the area of numbness may take several months to improve however we are hopeful to improve the pain aspect within a few weeks.  Proceed with the appointment with physical therapy that was lined up through [facility]. I do think that will be helpful.  Follow-up for ongoing or recalcitrant issues  Elige Radon, MD Internal Medicine Resident PGY-3 [facility] 12/13/2020 4:45 PM     Patient seen and  evaluated with the resident.  I agree with the above plan of care.  Phone follow-up with x-ray results when available.  In the meantime, proceed with physical therapy as ordered.  Continue with activity as tolerated and follow-up for ongoing or recalcitrant issues.  This note was dictated using Dragon naturally speaking software and may contain errors in syntax, spelling, or content which have not been identified prior to signing this note.    Addendum (December 21, 2020): X-rays of the scapula are reviewed.  They are unremarkable.  Patient will be notified via telephone of these results.

## 2020-12-14 ENCOUNTER — Ambulatory Visit
Admission: RE | Admit: 2020-12-14 | Discharge: 2020-12-14 | Disposition: A | Discharge status: Home / Self Care | Payer: 59 | Source: Ambulatory Visit | Attending: Sports Medicine | Admitting: Sports Medicine

## 2020-12-14 DIAGNOSIS — M25511 Pain in right shoulder: Secondary | ICD-10-CM

## 2020-12-22 ENCOUNTER — Telehealth: Payer: Self-pay

## 2020-12-22 NOTE — Telephone Encounter (Signed)
Tried to reach patient several times to go over x-ray results. VM is full so I was unable to leave a message.  If patient calls back please let them know the x-ray of the shoulder blade were normal. They will continue the plan as discussed at the last office visit.

## 2021-02-09 ENCOUNTER — Telehealth (INDEPENDENT_AMBULATORY_CARE_PROVIDER_SITE_OTHER): Payer: 59 | Admitting: Family Medicine

## 2021-02-09 ENCOUNTER — Encounter: Payer: Self-pay | Admitting: Family Medicine

## 2021-02-09 DIAGNOSIS — U071 COVID-19: Secondary | ICD-10-CM | POA: Diagnosis not present

## 2021-02-09 MED ORDER — BENZONATATE 100 MG PO CAPS: ORAL_CAPSULE | ORAL | 0 refills | Status: DC

## 2021-02-09 NOTE — Patient Instructions (Addendum)
° ° ° °--------------------------------------------------------------------------------------------------------------------------- ° ° ° °  WORK SLIP:  Patient Rhonda Benton,  1995-06-19, was seen for a medical visit today, 02/09/21 . Please excuse from work for a COVID/flu like illness. Advise 10 days minimum from the onset of symptoms (02/06/21) PLUS 1 day of no fever and improved symptoms would be best a most folks with covid will remain contagious for about 7-10 days. Will defer to employer for a sooner return to work if patient has 2 negative covid tests 48 hours apart and is feeling better or  symptoms have resolved, it is greater than 5 days since the positive test and the patient can wear a high-quality, tight fitting mask such as N95 or KN95 at all times for an additional 5 days.   Sincerely: E-signature: Dr. Kriste Basque, DO  [facility] Ph: [phone]   ------------------------------------------------------------------------------------------------------------------------------    HOME CARE TIPS:   -I sent the medication(s) we discussed to your pharmacy: Meds ordered this encounter  Medications   benzonatate (TESSALON PERLES) 100 MG capsule    Sig: 1-2 capsules up to twice daily as needed for cough.    Dispense:  30 capsule    Refill:  0     -can use tylenol or aleve if needed for fevers, aches and pains per instructions  -can use nasal saline a few times per day if you have nasal congestion; sometimes  a short course of Afrin nasal spray for 3 days can help with symptoms as well  -stay hydrated, drink plenty of fluids and eat small healthy meals - avoid dairy  -can take 1000 IU ( ) Vit D3 and 100-500 mg of Vit C daily per instructions   -stay home while sick, except to seek medical care. If you have COVID19, you will likely be contagious for 7-10 days. Flu or Influenza is likely contagious for about 7 days. Other respiratory viral infections  remain contagious for 5-10+ days depending on the virus and many other factors. Wear a good mask that fits snugly (such as N95 or KN95) if around others to reduce the risk of transmission.  It was nice to meet you today, and I really hope you are feeling better soon. I help  out with telemedicine visits on Tuesdays and Thursdays and am happy to help if you need a follow up virtual visit on those days. Otherwise, if you have any concerns or questions following this visit please schedule a follow up visit with your Primary Care doctor or seek care at a local urgent care clinic to avoid delays in care.    Seek in person care or schedule a follow up video visit promptly if your symptoms worsen, new concerns arise or you are not improving with treatment. Call 911 and/or seek emergency care if your symptoms are severe or life threatening.

## 2021-02-09 NOTE — Progress Notes (Signed)
Virtual Visit via Video Note  I connected with Rhonda Benton  on 02/09/21 at  1:00 PM EST by a video enabled telemedicine application and verified that I am speaking with the correct person using two identifiers.  Location patient: home, [place] Location provider:work or home office Persons participating in the virtual visit: patient, provider  I discussed the limitations of evaluation and management by telemedicine and the availability of in person appointments. The patient expressed understanding and agreed to proceed.   HPI:  Acute telemedicine visit for Covid19: -Onset: 3 days ago, tested positive for covid yesterday -Symptoms include: cough, congestion, chills, body aches, fever yesterday - doing a little better today -Denies: fever, CP or SOB today, NVD -Has tried: drinking fluid, eating some -Pertinent past medical history: see below -Pertinent medication allergies:No Known Allergies -COVID-19 vaccine status: has had 2 doses and 2 boosters Immunization History  Administered Date(s) Administered   Influenza,inj,Quad PF,6+ Mos 11/21/2018   PFIZER(Purple Top)SARS-COV-2 Vaccination 07/08/2019, 08/07/2019, 02/09/2020  -denies any chance of pregnancy ROS: See pertinent positives and negatives per HPI.  Past Medical History:  Diagnosis Date   Anxiety    Depression     Past Surgical History:  Procedure Laterality Date   WISDOM TOOTH EXTRACTION       Current Outpatient Medications:    benzonatate (TESSALON PERLES) 100 MG capsule, 1-2 capsules up to twice daily as needed for cough., Disp: 30 capsule, Rfl: 0   venlafaxine XR (EFFEXOR-XR) 150 MG 24 hr capsule, Take 1 capsule (150 mg total) by mouth daily., Disp:  , Rfl:   EXAM:  VITALS per patient if applicable:  GENERAL: alert, oriented, appears well and in no acute distress  HEENT: atraumatic, conjunttiva clear, no obvious abnormalities on inspection of external nose and ears  NECK: normal movements of the head and neck  LUNGS:  on inspection no signs of respiratory distress, breathing rate appears normal, no obvious gross SOB, gasping or wheezing  CV: no obvious cyanosis  MS: moves all visible extremities without noticeable abnormality  PSYCH/NEURO: pleasant and cooperative, no obvious depression or anxiety, speech and thought processing grossly intact  ASSESSMENT AND PLAN:  Discussed the following assessment and plan:  COVID-19   Discussed treatment options, potential complications, isolation and precautions for COVID-19.  After lengthy discussion, the patient opted for treatment with Tessalon Rx for cough, and other symptomatic care measures summarized in patient instructions. Work/School slipped offered: provided in patient instructions   Advised to seek prompt in person care if worsening, new symptoms arise, or if is not improving with treatment. Discussed options for inperson care if PCP office not available. Did let this patient know that I  do telemedicine on Tuesdays and Thursdays for [name]. Advised to schedule follow up visit with PCP or [facility] if any further questions or concerns to avoid delays in care.   I discussed the assessment and treatment plan with the patient. The patient was provided an opportunity to ask questions and all were answered. The patient agreed with the plan and demonstrated an understanding of the instructions.     Rhonda Koyanagi, DO

## 2021-10-16 ENCOUNTER — Encounter: Payer: Self-pay | Admitting: Internal Medicine

## 2021-10-16 NOTE — Patient Instructions (Addendum)
Tetanus vaccine today   Blood work was ordered.     Medications changes include :  none     Return in about 1 year (around 10/18/2022) for Physical Exam.   Health Maintenance, Female Adopting a healthy lifestyle and getting preventive care are important in promoting health and wellness. Ask your health care provider about: The right schedule for you to have regular tests and exams. Things you can do on your own to prevent diseases and keep yourself healthy. What should I know about diet, weight, and exercise? Eat a healthy diet  Eat a diet that includes plenty of vegetables, fruits, low-fat dairy products, and lean protein. Do not eat a lot of foods that are high in solid fats, added sugars, or sodium. Maintain a healthy weight Body mass index (BMI) is used to identify weight problems. It estimates body fat based on height and weight. Your health care provider can help determine your BMI and help you achieve or maintain a healthy weight. Get regular exercise Get regular exercise. This is one of the most important things you can do for your health. Most adults should: Exercise for at least 150 minutes each week. The exercise should increase your heart rate and make you sweat (moderate-intensity exercise). Do strengthening exercises at least twice a week. This is in addition to the moderate-intensity exercise. Spend less time sitting. Even light physical activity can be beneficial. Watch cholesterol and blood lipids Have your blood tested for lipids and cholesterol at 28 years of age, then have this test every 5 years. Have your cholesterol levels checked more often if: Your lipid or cholesterol levels are high. You are older than 28 years of age. You are at high risk for heart disease. What should I know about cancer screening? Depending on your health history and family history, you may need to have cancer screening at various ages. This may include screening for: Breast  cancer. Cervical cancer. Colorectal cancer. Skin cancer. Lung cancer. What should I know about heart disease, diabetes, and high blood pressure? Blood pressure and heart disease High blood pressure causes heart disease and increases the risk of stroke. This is more likely to develop in people who have high blood pressure readings or are overweight. Have your blood pressure checked: Every 3-5 years if you are 35-70 years of age. Every year if you are 23 years old or older. Diabetes Have regular diabetes screenings. This checks your fasting blood sugar level. Have the screening done: Once every three years after age 67 if you are at a normal weight and have a low risk for diabetes. More often and at a younger age if you are overweight or have a high risk for diabetes. What should I know about preventing infection? Hepatitis B If you have a higher risk for hepatitis B, you should be screened for this virus. Talk with your health care provider to find out if you are at risk for hepatitis B infection. Hepatitis C Testing is recommended for: Everyone born from [birth year] through 1967. Anyone with known risk factors for hepatitis C. Sexually transmitted infections (STIs) Get screened for STIs, including gonorrhea and chlamydia, if: You are sexually active and are younger than 28 years of age. You are older than 28 years of age and your health care provider tells you that you are at risk for this type of infection. Your sexual activity has changed since you were last screened, and you are at increased risk for chlamydia or gonorrhea. Ask  your health care provider if you are at risk. Ask your health care provider about whether you are at high risk for HIV. Your health care provider may recommend a prescription medicine to help prevent HIV infection. If you choose to take medicine to prevent HIV, you should first get tested for HIV. You should then be tested every 3 months for as long as you are taking  the medicine. Pregnancy If you are about to stop having your period (premenopausal) and you may become pregnant, seek counseling before you get pregnant. Take 400 to 800 micrograms (mcg) of folic acid every day if you become pregnant. Ask for birth control (contraception) if you want to prevent pregnancy. Osteoporosis and menopause Osteoporosis is a disease in which the bones lose minerals and strength with aging. This can result in bone fractures. If you are 66 years old or older, or if you are at risk for osteoporosis and fractures, ask your health care provider if you should: Be screened for bone loss. Take a calcium or vitamin D supplement to lower your risk of fractures. Be given hormone replacement therapy (HRT) to treat symptoms of menopause. Follow these instructions at home: Alcohol use Do not drink alcohol if: Your health care provider tells you not to drink. You are pregnant, may be pregnant, or are planning to become pregnant. If you drink alcohol: Limit how much you have to: 0-1 drink a day. Know how much alcohol is in your drink. In the [place] one drink equals one 12 oz bottle of beer (355 mL), one 5 oz glass of wine (148 mL), or one 1 oz glass of hard liquor (44 mL). Lifestyle Do not use any products that contain nicotine or tobacco. These products include cigarettes, chewing tobacco, and vaping devices, such as e-cigarettes. If you need help quitting, ask your health care provider. Do not use street drugs. Do not share needles. Ask your health care provider for help if you need support or information about quitting drugs. General instructions Schedule regular health, dental, and eye exams. Stay current with your vaccines. Tell your health care provider if: You often feel depressed. You have ever been abused or do not feel safe at home. Summary Adopting a healthy lifestyle and getting preventive care are important in promoting health and wellness. Follow your health  care provider's instructions about healthy diet, exercising, and getting tested or screened for diseases. Follow your health care provider's instructions on monitoring your cholesterol and blood pressure. This information is not intended to replace advice given to you by your health care provider. Make sure you discuss any questions you have with your health care provider. Document Revised: 06/27/2020 Document Reviewed: 06/27/2020 Elsevier Patient Education  [place].

## 2021-10-16 NOTE — Progress Notes (Unsigned)
Subjective:    Patient ID: Rhonda Benton, adult    DOB: Jan 02, 1996, 26 y.o.   MRN: [id number]      HPI Bryelle is here for a Physical exam.    She is having a hard time sleeping - able to fall asleep but can not stay asleep.   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  Has not been as active and has gained weight.  Plans on going to [facility] for eval of ADD.     Medications and allergies reviewed with patient and updated if appropriate.  Current Outpatient Medications on File Prior to Visit  Medication Sig Dispense Refill   venlafaxine XR (EFFEXOR-XR) 150 MG 24 hr capsule Take 1 capsule (150 mg total) by mouth daily.     No current facility-administered medications on file prior to visit.    Review of Systems  Constitutional:  Negative for fever.  Eyes:  Negative for visual disturbance.  Respiratory:  Negative for cough, shortness of breath and wheezing.   Cardiovascular:  Negative for chest pain, palpitations and leg swelling.  Gastrointestinal:  Positive for anal bleeding (occ) and diarrhea (occ). Negative for abdominal pain, blood in stool, constipation and nausea.       Occ gerd  Genitourinary:  Negative for dysuria and hematuria.  Musculoskeletal:  Negative for arthralgias and back pain.  Skin:  Negative for rash.  Neurological:  Negative for dizziness, light-headedness and headaches.  Psychiatric/Behavioral:  Positive for dysphoric mood. The patient is nervous/anxious.        Objective:   Vitals:   10/17/21 0748  BP: 114/80  Pulse: (!) 102  Temp: 98 F (36.7 C)  SpO2: 98%   Filed Weights   10/17/21 0748  Weight: 178 lb (80.7 kg)   Body mass index is 30.55 kg/m.  BP Readings from Last 3 Encounters:  10/17/21 114/80  12/13/20 118/70  09/11/20 120/72    Wt Readings from Last 3 Encounters:  10/17/21 178 lb (80.7 kg)  12/13/20 140 lb (63.5 kg)  04/20/20 146 lb (66.2 kg)       Physical Exam Constitutional: She appears  well-developed and well-nourished. No distress.  HENT:  Head: Normocephalic and atraumatic.  Right Ear: External ear normal. Normal ear canal and TM Left Ear: External ear normal.  Normal ear canal and TM Mouth/Throat: Oropharynx is clear and moist.  Eyes: Conjunctivae normal.  Neck: Neck supple. No tracheal deviation present. No thyromegaly present.  No carotid bruit  Cardiovascular: Normal rate, regular rhythm and normal heart sounds.   No murmur heard.  No edema. Pulmonary/Chest: Effort normal and breath sounds normal. No respiratory distress. She has no wheezes. She has no rales.  Breast: deferred   Abdominal: Soft. She exhibits no distension. There is no tenderness.  Lymphadenopathy: She has no cervical adenopathy.  Skin: Skin is warm and dry. She is not diaphoretic.  Psychiatric: She has a normal mood and affect. Her behavior is normal.     Lab Results  Component Value Date   WBC 8.1 04/20/2020   HGB 11.8 (L) 04/20/2020   HCT 34.4 (L) 04/20/2020   PLT 204.0 04/20/2020   GLUCOSE 84 04/20/2020   CHOL 129 04/20/2020   TRIG 36.0 04/20/2020   HDL 51.10 04/20/2020   LDLCALC 71 04/20/2020   ALT 13 04/20/2020   AST 15 04/20/2020   NA 135 04/20/2020   K 3.6 04/20/2020   CL 105 04/20/2020   CREATININE 0.56 04/20/2020  BUN 9 04/20/2020   CO2 26 04/20/2020   TSH 0.86 04/20/2020         Assessment & Plan:   Physical exam: Screening blood work  ordered Exercise  not regular - will start kickball - encouraged increased exercise Weight  has gained weight -- discussed weight loss.  She is eating well. Will increase exercise and work on weight loss  Substance abuse  none - social etoh   Reviewed recommended immunizations.  Tdap today   Health Maintenance  Topic Date Due   Hepatitis C Screening  Never done   TETANUS/TDAP  Never done   PAP-Cervical Cytology Screening  Never done   PAP SMEAR-Modifier  Never done   COVID-19 Vaccine (4 - Pfizer series) 04/05/2020    INFLUENZA VACCINE  09/19/2021   HIV Screening  Completed   HPV VACCINES  Aged Out      Difficulty sleeping - will increase exercise and work on natural things to help with sleep.      See Problem List for Assessment and Plan of chronic medical problems.

## 2021-10-17 ENCOUNTER — Ambulatory Visit (INDEPENDENT_AMBULATORY_CARE_PROVIDER_SITE_OTHER): Payer: Commercial Managed Care - HMO | Admitting: Internal Medicine

## 2021-10-17 VITALS — BP 114/80 | HR 102 | Temp 98.0°F | Ht 64.0 in | Wt 178.0 lb

## 2021-10-17 DIAGNOSIS — Z114 Encounter for screening for human immunodeficiency virus [HIV]: Secondary | ICD-10-CM | POA: Diagnosis not present

## 2021-10-17 DIAGNOSIS — Z0000 Encounter for general adult medical examination without abnormal findings: Secondary | ICD-10-CM

## 2021-10-17 DIAGNOSIS — F4323 Adjustment disorder with mixed anxiety and depressed mood: Secondary | ICD-10-CM

## 2021-10-17 DIAGNOSIS — Z1159 Encounter for screening for other viral diseases: Secondary | ICD-10-CM

## 2021-10-17 DIAGNOSIS — Z136 Encounter for screening for cardiovascular disorders: Secondary | ICD-10-CM | POA: Diagnosis not present

## 2021-10-17 DIAGNOSIS — Z23 Encounter for immunization: Secondary | ICD-10-CM | POA: Diagnosis not present

## 2021-10-17 LAB — CBC WITH DIFFERENTIAL/PLATELET
Basophils Absolute: 0.1 10*3/uL (ref 0.0–0.1)
Basophils Relative: 0.6 % (ref 0.0–3.0)
Eosinophils Absolute: 0.1 10*3/uL (ref 0.0–0.7)
Eosinophils Relative: 1.1 % (ref 0.0–5.0)
HCT: 36.9 % (ref 36.0–46.0)
Hemoglobin: 12.2 g/dL (ref 12.0–15.0)
Lymphocytes Relative: 18.9 % (ref 12.0–46.0)
Lymphs Abs: 1.7 10*3/uL (ref 0.7–4.0)
MCHC: 33.1 g/dL (ref 30.0–36.0)
MCV: 82.2 fl (ref 78.0–100.0)
Monocytes Absolute: 0.6 10*3/uL (ref 0.1–1.0)
Monocytes Relative: 7.2 % (ref 3.0–12.0)
Neutro Abs: 6.3 10*3/uL (ref 1.4–7.7)
Neutrophils Relative %: 72.2 % (ref 43.0–77.0)
Platelets: 225 10*3/uL (ref 150.0–400.0)
RBC: 4.49 Mil/uL (ref 3.87–5.11)
RDW: 14.7 % (ref 11.5–15.5)
WBC: 8.7 10*3/uL (ref 4.0–10.5)

## 2021-10-17 LAB — LIPID PANEL
Cholesterol: 185 mg/dL (ref 0–200)
HDL: 53.2 mg/dL (ref >39.00)
LDL Cholesterol: 116 mg/dL — ABNORMAL HIGH (ref 0–99)
NonHDL: 132.29
Total CHOL/HDL Ratio: 3
Triglycerides: 82 mg/dL (ref 0.0–149.0)
VLDL: 16.4 mg/dL (ref 0.0–40.0)

## 2021-10-17 LAB — COMPREHENSIVE METABOLIC PANEL
ALT: 15 U/L (ref 0–35)
AST: 16 U/L (ref 0–37)
Albumin: 4.3 g/dL (ref 3.5–5.2)
Alkaline Phosphatase: 60 U/L (ref 39–117)
BUN: 9 mg/dL (ref 6–23)
CO2: 22 mEq/L (ref 19–32)
Calcium: 8.9 mg/dL (ref 8.4–10.5)
Chloride: 104 mEq/L (ref 96–112)
Creatinine, Ser: 0.59 mg/dL (ref 0.40–1.20)
GFR: 122.82 mL/min (ref >60.00)
Glucose, Bld: 89 mg/dL (ref 70–99)
Potassium: 4.4 mEq/L (ref 3.5–5.1)
Sodium: 135 mEq/L (ref 135–145)
Total Bilirubin: 0.3 mg/dL (ref 0.2–1.2)
Total Protein: 7.4 g/dL (ref 6.0–8.3)

## 2021-10-17 LAB — TSH: TSH: 2.96 u[IU]/mL (ref 0.35–5.50)

## 2021-10-17 MED ORDER — MULTIVITAMINS PO CAPS: 1.0000 | ORAL_CAPSULE | Freq: Every day | ORAL | Status: AC

## 2021-10-17 NOTE — Assessment & Plan Note (Signed)
Chronic Management per psychiatry Controlled, stable Continue effexor 150 mg daily

## 2021-10-18 LAB — HIV ANTIBODY (ROUTINE TESTING W REFLEX): HIV 1&2 Ab, 4th Generation: NONREACTIVE

## 2021-10-18 LAB — HEPATITIS C ANTIBODY: Hepatitis C Ab: NONREACTIVE

## 2022-03-03 ENCOUNTER — Ambulatory Visit (HOSPITAL_COMMUNITY): Payer: Commercial Managed Care - HMO
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
## 2023-01-14 ENCOUNTER — Encounter: Payer: Self-pay | Admitting: Internal Medicine

## 2023-01-15 ENCOUNTER — Ambulatory Visit: Payer: Commercial Managed Care - HMO | Admitting: Internal Medicine

## 2023-01-15 VITALS — BP 106/68 | HR 89 | Temp 98.6°F | Ht 64.0 in | Wt 170.2 lb

## 2023-01-15 DIAGNOSIS — R41840 Attention and concentration deficit: Secondary | ICD-10-CM

## 2023-01-15 DIAGNOSIS — Z0000 Encounter for general adult medical examination without abnormal findings: Secondary | ICD-10-CM | POA: Diagnosis not present

## 2023-01-15 DIAGNOSIS — F4323 Adjustment disorder with mixed anxiety and depressed mood: Secondary | ICD-10-CM

## 2023-01-15 DIAGNOSIS — F988 Other specified behavioral and emotional disorders with onset usually occurring in childhood and adolescence: Secondary | ICD-10-CM | POA: Insufficient documentation

## 2023-01-15 LAB — CBC WITH DIFFERENTIAL/PLATELET
Basophils Absolute: 0 10*3/uL (ref 0.0–0.1)
Basophils Relative: 0.4 % (ref 0.0–3.0)
Eosinophils Absolute: 0.1 10*3/uL (ref 0.0–0.7)
Eosinophils Relative: 1.4 % (ref 0.0–5.0)
HCT: 39.3 % (ref 36.0–46.0)
Hemoglobin: 12.7 g/dL (ref 12.0–15.0)
Lymphocytes Relative: 20.5 % (ref 12.0–46.0)
Lymphs Abs: 1.7 10*3/uL (ref 0.7–4.0)
MCHC: 32.3 g/dL (ref 30.0–36.0)
MCV: 86.9 fL (ref 78.0–100.0)
Monocytes Absolute: 0.7 10*3/uL (ref 0.1–1.0)
Monocytes Relative: 8.2 % (ref 3.0–12.0)
Neutro Abs: 5.8 10*3/uL (ref 1.4–7.7)
Neutrophils Relative %: 69.5 % (ref 43.0–77.0)
Platelets: 232 10*3/uL (ref 150.0–400.0)
RBC: 4.52 Mil/uL (ref 3.87–5.11)
RDW: 15.2 % (ref 11.5–15.5)
WBC: 8.3 10*3/uL (ref 4.0–10.5)

## 2023-01-15 LAB — TSH: TSH: 1.25 u[IU]/mL (ref 0.35–5.50)

## 2023-01-15 LAB — COMPREHENSIVE METABOLIC PANEL
ALT: 24 U/L (ref 0–35)
AST: 18 U/L (ref 0–37)
Albumin: 4.3 g/dL (ref 3.5–5.2)
Alkaline Phosphatase: 69 U/L (ref 39–117)
BUN: 13 mg/dL (ref 6–23)
CO2: 26 meq/L (ref 19–32)
Calcium: 9.4 mg/dL (ref 8.4–10.5)
Chloride: 106 meq/L (ref 96–112)
Creatinine, Ser: 0.62 mg/dL (ref 0.40–1.20)
GFR: 120.3 mL/min (ref >60.00)
Glucose, Bld: 84 mg/dL (ref 70–99)
Potassium: 4.5 meq/L (ref 3.5–5.1)
Sodium: 138 meq/L (ref 135–145)
Total Bilirubin: 0.3 mg/dL (ref 0.2–1.2)
Total Protein: 6.9 g/dL (ref 6.0–8.3)

## 2023-01-15 LAB — LIPID PANEL
Cholesterol: 165 mg/dL (ref 0–200)
HDL: 48.9 mg/dL (ref >39.00)
LDL Cholesterol: 101 mg/dL — ABNORMAL HIGH (ref 0–99)
NonHDL: 116.46
Total CHOL/HDL Ratio: 3
Triglycerides: 76 mg/dL (ref 0.0–149.0)
VLDL: 15.2 mg/dL (ref 0.0–40.0)

## 2023-01-15 NOTE — Assessment & Plan Note (Signed)
Chronic Management per [facility] - telehealth Controlled, stable Continue effexor 150 mg daily

## 2023-01-15 NOTE — Patient Instructions (Addendum)
Flu immunization administered today.     Blood work was ordered.       Medications changes include :   None      Return in about 1 year (around 01/15/2024) for Physical Exam.     Health Maintenance, Female Adopting a healthy lifestyle and getting preventive care are important in promoting health and wellness. Ask your health care provider about: The right schedule for you to have regular tests and exams. Things you can do on your own to prevent diseases and keep yourself healthy. What should I know about diet, weight, and exercise? Eat a healthy diet  Eat a diet that includes plenty of vegetables, fruits, low-fat dairy products, and lean protein. Do not eat a lot of foods that are high in solid fats, added sugars, or sodium. Maintain a healthy weight Body mass index (BMI) is used to identify weight problems. It estimates body fat based on height and weight. Your health care provider can help determine your BMI and help you achieve or maintain a healthy weight. Get regular exercise Get regular exercise. This is one of the most important things you can do for your health. Most adults should: Exercise for at least 150 minutes each week. The exercise should increase your heart rate and make you sweat (moderate-intensity exercise). Do strengthening exercises at least twice a week. This is in addition to the moderate-intensity exercise. Spend less time sitting. Even light physical activity can be beneficial. Watch cholesterol and blood lipids Have your blood tested for lipids and cholesterol at 29 years of age, then have this test every 5 years. Have your cholesterol levels checked more often if: Your lipid or cholesterol levels are high. You are older than 29 years of age. You are at high risk for heart disease. What should I know about cancer screening? Depending on your health history and family history, you may need to have cancer screening at various ages. This may  include screening for: Breast cancer. Cervical cancer. Colorectal cancer. Skin cancer. Lung cancer. What should I know about heart disease, diabetes, and high blood pressure? Blood pressure and heart disease High blood pressure causes heart disease and increases the risk of stroke. This is more likely to develop in people who have high blood pressure readings or are overweight. Have your blood pressure checked: Every 3-5 years if you are 26-67 years of age. Every year if you are 75 years old or older. Diabetes Have regular diabetes screenings. This checks your fasting blood sugar level. Have the screening done: Once every three years after age 2 if you are at a normal weight and have a low risk for diabetes. More often and at a younger age if you are overweight or have a high risk for diabetes. What should I know about preventing infection? Hepatitis B If you have a higher risk for hepatitis B, you should be screened for this virus. Talk with your health care provider to find out if you are at risk for hepatitis B infection. Hepatitis C Testing is recommended for: Everyone born from [birth year] through 1967. Anyone with known risk factors for hepatitis C. Sexually transmitted infections (STIs) Get screened for STIs, including gonorrhea and chlamydia, if: You are sexually active and are younger than 29 years of age. You are older than 29 years of age and your health care provider tells you that you are at risk for this type of infection. Your sexual activity has changed since you were last screened,  and you are at increased risk for chlamydia or gonorrhea. Ask your health care provider if you are at risk. Ask your health care provider about whether you are at high risk for HIV. Your health care provider may recommend a prescription medicine to help prevent HIV infection. If you choose to take medicine to prevent HIV, you should first get tested for HIV. You should then be tested every 3 months  for as long as you are taking the medicine. Pregnancy If you are about to stop having your period (premenopausal) and you may become pregnant, seek counseling before you get pregnant. Take 400 to 800 micrograms (mcg) of folic acid every day if you become pregnant. Ask for birth control (contraception) if you want to prevent pregnancy. Osteoporosis and menopause Osteoporosis is a disease in which the bones lose minerals and strength with aging. This can result in bone fractures. If you are 59 years old or older, or if you are at risk for osteoporosis and fractures, ask your health care provider if you should: Be screened for bone loss. Take a calcium or vitamin D supplement to lower your risk of fractures. Be given hormone replacement therapy (HRT) to treat symptoms of menopause. Follow these instructions at home: Alcohol use Do not drink alcohol if: Your health care provider tells you not to drink. You are pregnant, may be pregnant, or are planning to become pregnant. If you drink alcohol: Limit how much you have to: 0-1 drink a day. Know how much alcohol is in your drink. In the [place] one drink equals one 12 oz bottle of beer (355 mL), one 5 oz glass of wine (148 mL), or one 1 oz glass of hard liquor (44 mL). Lifestyle Do not use any products that contain nicotine or tobacco. These products include cigarettes, chewing tobacco, and vaping devices, such as e-cigarettes. If you need help quitting, ask your health care provider. Do not use street drugs. Do not share needles. Ask your health care provider for help if you need support or information about quitting drugs. General instructions Schedule regular health, dental, and eye exams. Stay current with your vaccines. Tell your health care provider if: You often feel depressed. You have ever been abused or do not feel safe at home. Summary Adopting a healthy lifestyle and getting preventive care are important in promoting health and  wellness. Follow your health care provider's instructions about healthy diet, exercising, and getting tested or screened for diseases. Follow your health care provider's instructions on monitoring your cholesterol and blood pressure. This information is not intended to replace advice given to you by your health care provider. Make sure you discuss any questions you have with your health care provider. Document Revised: 06/27/2020 Document Reviewed: 06/27/2020 Elsevier Patient Education  2024 [employer].

## 2023-01-15 NOTE — Progress Notes (Signed)
Subjective:    Patient ID: Rhonda Benton, adult    DOB: 10-Aug-1995, 27 y.o.   MRN: [id number]      HPI Rhonda Benton is here for a Physical exam and her chronic medical problems.   Overall doing ok - not currently working.  Looking for another job.   Follows with [facility] - telehealth through [place] - needs form filled out for them.   Medications and allergies reviewed with patient and updated if appropriate.  Current Outpatient Medications on File Prior to Visit  Medication Sig Dispense Refill   amphetamine-dextroamphetamine (ADDERALL XR) 20 MG 24 hr capsule Take 20 mg by mouth daily as needed.     Multiple Vitamin (MULTIVITAMIN) capsule Take 1 capsule by mouth daily.     venlafaxine XR (EFFEXOR-XR) 150 MG 24 hr capsule Take 1 capsule (150 mg total) by mouth daily.     No current facility-administered medications on file prior to visit.    Review of Systems  Constitutional:  Negative for fever.  Eyes:  Negative for visual disturbance.  Respiratory:  Negative for cough, shortness of breath and wheezing.   Cardiovascular:  Negative for chest pain, palpitations and leg swelling.  Gastrointestinal:  Positive for anal bleeding (hemorrhoidal - occ). Negative for abdominal pain, blood in stool, constipation and diarrhea.       No gerd  Genitourinary:  Negative for difficulty urinating and dysuria.  Musculoskeletal:  Negative for arthralgias and back pain.  Skin:  Negative for rash.  Neurological:  Negative for light-headedness and headaches.  Psychiatric/Behavioral:  Positive for dysphoric mood (controlled) and sleep disturbance (fluctuates- sometimes hard times staying asleep). The patient is nervous/anxious (controlled).        Objective:   Vitals:   01/15/23 1018  BP: 106/68  Pulse: 89  Temp: 98.6 F (37 C)  SpO2: 98%   Filed Weights   01/15/23 1018  Weight: 170 lb 3.2 oz (77.2 kg)   Body mass index is 29.21 kg/m.  BP Readings from Last 3 Encounters:   01/15/23 106/68  10/17/21 114/80  12/13/20 118/70    Wt Readings from Last 3 Encounters:  01/15/23 170 lb 3.2 oz (77.2 kg)  10/17/21 178 lb (80.7 kg)  12/13/20 140 lb (63.5 kg)       Physical Exam Constitutional: She appears well-developed and well-nourished. No distress.  HENT:  Head: Normocephalic and atraumatic.  Right Ear: External ear normal. Normal ear canal and TM Left Ear: External ear normal.  Normal ear canal and TM Mouth/Throat: Oropharynx is clear and moist.  Eyes: Conjunctivae normal.  Neck: Neck supple. No tracheal deviation present. No thyromegaly present.  No carotid bruit  Cardiovascular: Normal rate, regular rhythm and normal heart sounds.   No murmur heard.  No edema. Pulmonary/Chest: Effort normal and breath sounds normal. No respiratory distress. She has no wheezes. She has no rales.  Breast: deferred   Abdominal: Soft. She exhibits no distension. There is no tenderness.  Lymphadenopathy: She has no cervical adenopathy.  Skin: Skin is warm and dry. She is not diaphoretic.  Psychiatric: She has a normal mood and affect. Her behavior is normal.     Lab Results  Component Value Date   WBC 8.7 10/17/2021   HGB 12.2 10/17/2021   HCT 36.9 10/17/2021   PLT 225.0 10/17/2021   GLUCOSE 89 10/17/2021   CHOL 185 10/17/2021   TRIG 82.0 10/17/2021   HDL 53.20 10/17/2021   LDLCALC 116 (H) 10/17/2021   ALT 15 10/17/2021  AST 16 10/17/2021   NA 135 10/17/2021   K 4.4 10/17/2021   CL 104 10/17/2021   CREATININE 0.59 10/17/2021   BUN 9 10/17/2021   CO2 22 10/17/2021   TSH 2.96 10/17/2021         Assessment & Plan:   Physical exam: Screening blood work  ordered Exercise  walking 5 miles a day Weight  overweight Substance abuse  none - 8 months sober   Reviewed recommended immunizations. Flu immunization administered today.     Health Maintenance  Topic Date Due   Cervical Cancer Screening (Pap smear)  Never done   INFLUENZA VACCINE   09/20/2022   COVID-19 Vaccine (4 - 2023-24 season) 01/31/2023 (Originally 10/21/2022)   DTaP/Tdap/Td (2 - Td or Tdap) 10/18/2031   Hepatitis C Screening  Completed   HIV Screening  Completed   HPV VACCINES  Aged Out      Will f/u with gyn - due for pap    See Problem List for Assessment and Plan of chronic medical problems.

## 2023-01-15 NOTE — Assessment & Plan Note (Signed)
New Diagnosed last year Management per [facility] - telehealth Controlled, stable Continue Adderall Xr 20 mg daily prn

## 2023-04-23 ENCOUNTER — Encounter: Payer: Self-pay | Admitting: Physician Assistant

## 2023-06-03 NOTE — Progress Notes (Unsigned)
 Rhonda Amy, PA-C [address] [place], [place]  [address] Phone: [phone]   Gastroenterology Consultation  Referring Provider:     Key, Rhonda Schneiders, NP Primary Care Physician:  Rhonda Sanes, MD Primary Gastroenterologist:  Rhonda Amy, PA-C / Rhonda Amass, MD  Reason for Consultation:     Painless Rectal Bleeding        HPI:   Rhonda Benton is a 28 y.o. y/o adult referred for consultation & management  by Rhonda Sanes, MD.    Patient reports having episodes of bright red rectal bleeding for 6 to 9 months.  Has loose stools with occasional episode of  constipation.  Has rectal burning only if has a lot of diarrhea.   No previous colonoscopy or GI evaluation.  Denies abdominal pain.  Has had 30 pound weight loss in the past 6 months attributed to increased physical activity.  Family history is significant for mother with Crohn's disease and brother with ulcerative colitis.  Mother had colectomy with ileostomy due to precancerous changes in the colon.  Maternal great grandmother had colon cancer.  12/2022 labs: Normal CBC, CMP, and TSH.  Hemoglobin 12.7.  Past Medical History:  Diagnosis Date   Alcoholism (HCC)    Anxiety    Attention deficit disorder (ADD)    Depression     Past Surgical History:  Procedure Laterality Date   WISDOM TOOTH EXTRACTION      Prior to Admission medications   Medication Sig Start Date End Date Taking? Authorizing Provider  amphetamine-dextroamphetamine (ADDERALL XR) 20 MG 24 hr capsule Take 20 mg by mouth daily as needed.    [provider]  Multiple Vitamin (MULTIVITAMIN) capsule Take 1 capsule by mouth daily. 10/17/21   Rhonda Sanes, MD  venlafaxine XR (EFFEXOR-XR) 150 MG 24 hr capsule Take 1 capsule (150 mg total) by mouth daily. 11/21/18   Rhonda Sanes, MD    Family History  Problem Relation Age of Onset   High Cholesterol Mother    Crohn's disease Mother    Other Mother        iliostomy   Prostate  cancer Father    Colitis Brother    Breast cancer Maternal Grandmother    Kidney disease Maternal Grandfather    Skin cancer Maternal Grandfather    Hearing loss Paternal Grandmother    Heart attack Paternal Grandmother    Prostate cancer Paternal Grandfather    Diabetes Paternal Grandfather    Hearing loss Paternal Grandfather    Heart disease Maternal Aunt    Heart disease Maternal Uncle    Diabetes Other    Colon cancer Maternal Great-grandmother      Social History   Tobacco Use   Smoking status: Former    Types: Cigarettes   Smokeless tobacco: Never  Vaping Use   Vaping status: Every Day  Substance Use Topics   Alcohol use: Not Currently   Drug use: Yes    Types: Marijuana    Allergies as of 06/04/2023   (No Known Allergies)    Review of Systems:    All systems reviewed and negative except where noted in HPI.   Physical Exam:  BP 120/72 (Cuff Size: Normal)   Pulse 98   Ht 5\' 4"  (1.626 m)   Wt 153 lb (69.4 kg)   LMP 05/27/2023 (Exact Date)   BMI 26.26 kg/m  Patient's last menstrual period was 05/27/2023 (exact date).  General:   Alert,  Well-developed, well-nourished, pleasant and cooperative in NAD Lungs:  Respirations even and unlabored.  Clear throughout to auscultation.   No wheezes, crackles, or rhonchi. No acute distress. Heart:  Regular rate and rhythm; no murmurs, clicks, rubs, or gallops. Abdomen:  Normal bowel sounds.  No bruits.  Soft, and non-distended without masses, hepatosplenomegaly or hernias noted.  No Tenderness.  No guarding or rebound tenderness.   Rectal: Normal.  No external Hemorrhoids, rashes or lesions.  No rectal tenderness or masses.  Hemocult Negative.  Neurologic:  Alert and oriented x3;  grossly normal neurologically. Psych:  Alert and cooperative. Normal mood and affect.  Exam chaperoned by Rhonda Benton, CMA.   Imaging Studies: No results found.  Assessment and Plan:   Rhonda Benton is a 28 y.o. y/o adult has been  referred for:   1.  Rectal bleeding; no anemia.  Normal rectal exam today.  Differential includes internal hemorrhoids versus IBD.  Scheduling Colonoscopy I discussed risks of colonoscopy with patient to include risk of bleeding, colon perforation, and risk of sedation.  Patient expressed understanding and agrees to proceed with colonoscopy.   2.  Family history of IBD: Mother with Crohn's disease; Brother with ulcerative colitis.  Scheduling Colonoscopy  3.  Constipation, Mild Recommend High Fiber diet with fruits, vegetables, and whole grains. Drink 64 ounces of Fluids Daily. Start Miralax Mix 1 capful in a drink daily as needed.  Follow up As needed based on Colonoscopy results and GI symptoms.  Rhonda Canard, PA-C

## 2023-06-04 ENCOUNTER — Encounter: Payer: Self-pay | Admitting: Physician Assistant

## 2023-06-04 ENCOUNTER — Ambulatory Visit: Admitting: Physician Assistant

## 2023-06-04 VITALS — BP 120/72 | HR 98 | Ht 64.0 in | Wt 153.0 lb

## 2023-06-04 DIAGNOSIS — Z8379 Family history of other diseases of the digestive system: Secondary | ICD-10-CM

## 2023-06-04 DIAGNOSIS — K5900 Constipation, unspecified: Secondary | ICD-10-CM

## 2023-06-04 DIAGNOSIS — K625 Hemorrhage of anus and rectum: Secondary | ICD-10-CM

## 2023-06-04 MED ORDER — NA SULFATE-K SULFATE-MG SULF 17.5-3.13-1.6 GM/177ML PO SOLN: 1.0000 | Freq: Once | ORAL | 0 refills | Status: AC

## 2023-06-04 NOTE — Patient Instructions (Addendum)
 We have sent the following medications to your pharmacy for you to pick up at your convenience: Suprep  You have been scheduled for a colonoscopy. Please follow written instructions given to you at your visit today.   If you use inhalers (even only as needed), please bring them with you on the day of your procedure.  DO NOT TAKE 7 DAYS PRIOR TO TEST- Trulicity (dulaglutide) Ozempic, Wegovy (semaglutide) Mounjaro (tirzepatide) Bydureon Bcise (exanatide extended release)  DO NOT TAKE 1 DAY PRIOR TO YOUR TEST Rybelsus (semaglutide) Adlyxin (lixisenatide) Victoza (liraglutide) Byetta (exanatide) ___________________________________________________________________________      For constipation: Start OTC Miralax Powder Mix 1 capful in 6 to 8 ounces of a drink once daily  Recommend high-fiber diet, 30 g of fiber daily Eat fruits, vegetables, and whole grains Drink 64 ounces of water / fluids daily.   _______________________________________________________  If your blood pressure at your visit was 140/90 or greater, please contact your primary care physician to follow up on this.  _______________________________________________________  If you are age 23 or older, your body mass index should be between 23-30. Your Body mass index is 26.26 kg/m. If this is out of the aforementioned range listed, please consider follow up with your Primary Care Provider.  If you are age 9 or younger, your body mass index should be between 19-25. Your Body mass index is 26.26 kg/m. If this is out of the aformentioned range listed, please consider follow up with your Primary Care Provider.   ________________________________________________________  The [facility] providers would like to encourage you to use MYCHART to communicate with providers for non-urgent requests or questions.  Due to long hold times on the telephone, sending your provider a message by [facility] may be a faster and more efficient  way to get a response.  Please allow 48 business hours for a response.  Please remember that this is for non-urgent requests.  _______________________________________________________

## 2023-06-07 ENCOUNTER — Encounter: Payer: Self-pay | Admitting: Internal Medicine

## 2023-06-13 NOTE — Progress Notes (Signed)
 Agree with the assessment and plan as outlined by Brigitte Canard, PA-C.  Hematochezia most likely hemorrhoidal, but given family history, colonoscopy to exclude IBD and mass lesion reasonable.

## 2023-06-24 ENCOUNTER — Encounter: Payer: Self-pay | Admitting: Gastroenterology

## 2023-07-01 ENCOUNTER — Ambulatory Visit (AMBULATORY_SURGERY_CENTER): Admitting: Gastroenterology

## 2023-07-01 ENCOUNTER — Encounter: Payer: Self-pay | Admitting: Gastroenterology

## 2023-07-01 VITALS — BP 91/64 | HR 82 | Temp 98.1°F | Resp 11 | Ht 64.0 in | Wt 153.0 lb

## 2023-07-01 DIAGNOSIS — K635 Polyp of colon: Secondary | ICD-10-CM | POA: Diagnosis not present

## 2023-07-01 DIAGNOSIS — K625 Hemorrhage of anus and rectum: Secondary | ICD-10-CM

## 2023-07-01 DIAGNOSIS — K5730 Diverticulosis of large intestine without perforation or abscess without bleeding: Secondary | ICD-10-CM | POA: Diagnosis not present

## 2023-07-01 DIAGNOSIS — D125 Benign neoplasm of sigmoid colon: Secondary | ICD-10-CM

## 2023-07-01 DIAGNOSIS — K529 Noninfective gastroenteritis and colitis, unspecified: Secondary | ICD-10-CM | POA: Diagnosis not present

## 2023-07-01 MED ORDER — SODIUM CHLORIDE 0.9 % IV SOLN: 500.0000 mL | INTRAVENOUS | Status: DC

## 2023-07-01 NOTE — Op Note (Signed)
 [facility] Patient Name: Rhonda Benton Procedure Date: 07/01/2023 9:56 AM MRN: [id number] Endoscopist: Geralyn Knee E. Cherryl Corona , MD, [id number] Age: 28 Referring MD:  Date of Birth: Apr 29, 1995 Gender: Female Account #: [id number] Procedure:                Colonoscopy Indications:              Hematochezia Medicines:                Monitored Anesthesia Care Procedure:                Pre-Anesthesia Assessment:                           - Prior to the procedure, a History and Physical                            was performed, and patient medications and                            allergies were reviewed. The patient's tolerance of                            previous anesthesia was also reviewed. The risks                            and benefits of the procedure and the sedation                            options and risks were discussed with the patient.                            All questions were answered, and informed consent                            was obtained. Prior Anticoagulants: The patient has                            taken no anticoagulant or antiplatelet agents. ASA                            Grade Assessment: II - A patient with mild systemic                            disease. After reviewing the risks and benefits,                            the patient was deemed in satisfactory condition to                            undergo the procedure.                           After obtaining informed consent, the colonoscope  was passed under direct vision. Throughout the                            procedure, the patient's blood pressure, pulse, and                            oxygen saturations were monitored continuously. The                            Olympus CF-HQ190L ([id number]) Colonoscope was                            introduced through the anus and advanced to the the                            terminal ileum, with identification of the                             appendiceal orifice and IC valve. The colonoscopy                            was performed without difficulty. The patient                            tolerated the procedure well. The quality of the                            bowel preparation was excellent. The terminal                            ileum, ileocecal valve, appendiceal orifice, and                            rectum were photographed. The bowel preparation                            used was SUPREP via split dose instruction. Scope In: 10:07:54 AM Scope Out: 10:21:36 AM Scope Withdrawal Time: 0 hours 9 minutes 45 seconds  Total Procedure Duration: 0 hours 13 minutes 42 seconds  Findings:                 The perianal and digital rectal examinations were                            normal. Pertinent negatives include normal                            sphincter tone and no palpable rectal lesions.                           A 3 mm polyp was found in the sigmoid colon. The                            polyp was sessile. The polyp was removed  with a                            cold snare. Resection and retrieval were complete.                            Estimated blood loss was minimal.                           A single small-mouthed diverticulum was found in                            the sigmoid colon.                           The exam was otherwise normal throughout the                            examined colon.                           The terminal ileum contained multiple (5-6) small                            ulcers. Biopsies were taken with a cold forceps for                            histology. Estimated blood loss was minimal.                           The remainder of the exam in the terminal ileum was                            normal.                           The retroflexed view of the distal rectum and anal                            verge was normal and showed no anal or rectal                             abnormalities. Complications:            No immediate complications. Estimated Blood Loss:     Estimated blood loss was minimal. Impression:               - One 3 mm polyp in the sigmoid colon, removed with                            a cold snare. Resected and retrieved.                           - Mild diverticulosis in the sigmoid colon.                           -  Multiple ulcers in the terminal ileum. Biopsied.                            Differential includes NSAID enteropathy vs.                            preclinical Crohn's disease.                           - The distal rectum and anal verge are normal on                            retroflexion view.                           - Patient's hematochezia most likely secondary to                            internal hemorrhoids, although no prominent                            hemorrhoids seen on today's exam. Recommendation:           - Patient has a contact number available for                            emergencies. The signs and symptoms of potential                            delayed complications were discussed with the                            patient. Return to normal activities tomorrow.                            Written discharge instructions were provided to the                            patient.                           - Resume previous diet.                           - Continue present medications.                           - Await pathology results.                           - Will discuss further evaluation /treatment of                            ileal ulcers with patient following biopsy results. Taisa Deloria E. Cherryl Corona, MD 07/01/2023 10:29:34 AM This report has been signed electronically.

## 2023-07-01 NOTE — Progress Notes (Signed)
 Called to room to assist during endoscopic procedure.  Patient ID and intended procedure confirmed with present staff. Received instructions for my participation in the procedure from the performing physician.

## 2023-07-01 NOTE — Progress Notes (Signed)
 Pt resting comfortably. VSS. Airway intact. SBAR complete to RN. All questions answered.

## 2023-07-01 NOTE — Progress Notes (Signed)
 History and Physical Interval Note:  07/01/2023 10:00 AM  Rhonda Benton  has presented today for endoscopic procedure(s), with the diagnosis of  Encounter Diagnosis  Name Primary?   Rectal bleeding Yes  .  The various methods of evaluation and treatment have been discussed with the patient and/or family. After consideration of risks, benefits and other options for treatment, the patient has consented to  the endoscopic procedure(s).   The patient's history has been reviewed, patient examined, no change in status, stable for endoscopic procedure(s).  I have reviewed the patient's chart and labs.  Questions were answered to the patient's satisfaction.     Jerek Meulemans E. Cherryl Corona, MD [facility]

## 2023-07-01 NOTE — Patient Instructions (Signed)
 Handout on polyps, hemorrhoids and diverticulosis provided   YOU HAD AN ENDOSCOPIC PROCEDURE TODAY AT THE [facility]:   Refer to the procedure report that was given to you for any specific questions about what was found during the examination.  If the procedure report does not answer your questions, please call your gastroenterologist to clarify.  If you requested that your care partner not be given the details of your procedure findings, then the procedure report has been included in a sealed envelope for you to review at your convenience later.  YOU SHOULD EXPECT: Some feelings of bloating in the abdomen. Passage of more gas than usual.  Walking can help get rid of the air that was put into your GI tract during the procedure and reduce the bloating. If you had a lower endoscopy (such as a colonoscopy or flexible sigmoidoscopy) you may notice spotting of blood in your stool or on the toilet paper. If you underwent a bowel prep for your procedure, you may not have a normal bowel movement for a few days.  Please Note:  You might notice some irritation and congestion in your nose or some drainage.  This is from the oxygen used during your procedure.  There is no need for concern and it should clear up in a day or so.  SYMPTOMS TO REPORT IMMEDIATELY:  Following lower endoscopy (colonoscopy or flexible sigmoidoscopy):  Excessive amounts of blood in the stool  Significant tenderness or worsening of abdominal pains  Swelling of the abdomen that is new, acute   For urgent or emergent issues, a gastroenterologist can be reached at any hour by calling [phone]. Do not use MyChart messaging for urgent concerns.    DIET:  We do recommend a small meal at first, but then you may proceed to your regular diet.  Drink plenty of fluids but you should avoid alcoholic beverages for 24 hours.  ACTIVITY:  You should plan to take it easy for the rest of today and you should NOT DRIVE or use heavy  machinery until tomorrow (because of the sedation medicines used during the test).    FOLLOW UP: Our staff will call the number listed on your records the next business day following your procedure.  We will call around 7:15- 8:00 am to check on you and address any questions or concerns that you may have regarding the information given to you following your procedure. If we do not reach you, we will leave a message.     If any biopsies were taken you will be contacted by phone or by letter within the next 1-3 weeks.  Please call us  at [phone] if you have not heard about the biopsies in 3 weeks.    SIGNATURES/CONFIDENTIALITY: You and/or your care partner have signed paperwork which will be entered into your electronic medical record.  These signatures attest to the fact that that the information above on your After Visit Summary has been reviewed and is understood.  Full responsibility of the confidentiality of this discharge information lies with you and/or your care-partner.

## 2023-07-02 ENCOUNTER — Telehealth: Payer: Self-pay

## 2023-07-02 NOTE — Telephone Encounter (Signed)
 Left message on follow up call.

## 2023-07-03 LAB — SURGICAL PATHOLOGY

## 2023-07-08 ENCOUNTER — Ambulatory Visit: Payer: Self-pay | Admitting: Gastroenterology

## 2023-07-08 DIAGNOSIS — R933 Abnormal findings on diagnostic imaging of other parts of digestive tract: Secondary | ICD-10-CM

## 2023-07-08 NOTE — Progress Notes (Signed)
 Rhonda Benton,  The biopsies of your ileum showed some evidence of chronic inflammation.  I suspect you likely have Crohn's disease, but currently are asymptomatic.  I would like to get some additional blood and stool tests and then follow up with you in the office to discuss options going forward.  Team,  Can you please order a fecal calprotectin, IBD serology panel, CRP,  B12 level and iron panel and book a follow up visit?

## 2023-07-29 ENCOUNTER — Ambulatory Visit: Admitting: Gastroenterology

## 2023-08-21 ENCOUNTER — Other Ambulatory Visit (INDEPENDENT_AMBULATORY_CARE_PROVIDER_SITE_OTHER)

## 2023-08-21 DIAGNOSIS — R933 Abnormal findings on diagnostic imaging of other parts of digestive tract: Secondary | ICD-10-CM

## 2023-08-21 LAB — IBC + FERRITIN
Ferritin: 16.6 ng/mL (ref 10.0–291.0)
Iron: 84 ug/dL (ref 42–145)
Saturation Ratios: 19.9 % — ABNORMAL LOW (ref 20.0–50.0)
TIBC: 421.4 ug/dL (ref 250.0–450.0)
Transferrin: 301 mg/dL (ref 212.0–360.0)

## 2023-08-21 LAB — C-REACTIVE PROTEIN: CRP: <1 mg/dL (ref 0.5–20.0)

## 2023-08-21 LAB — VITAMIN B12: Vitamin B-12: 229 pg/mL (ref 211–911)

## 2023-08-22 LAB — IBD EXPANDED PANEL
ACCA: 17 U (ref 0–90)
ALCA: 4 U (ref 0–60)
AMCA: 25 U (ref 0–100)
Atypical pANCA: NEGATIVE
gASCA: 8 U (ref 0–50)

## 2023-08-26 ENCOUNTER — Other Ambulatory Visit

## 2023-08-26 DIAGNOSIS — R933 Abnormal findings on diagnostic imaging of other parts of digestive tract: Secondary | ICD-10-CM

## 2023-08-28 ENCOUNTER — Ambulatory Visit (INDEPENDENT_AMBULATORY_CARE_PROVIDER_SITE_OTHER): Admitting: Gastroenterology

## 2023-08-28 ENCOUNTER — Ambulatory Visit: Payer: Self-pay | Admitting: Gastroenterology

## 2023-08-28 ENCOUNTER — Encounter: Payer: Self-pay | Admitting: Gastroenterology

## 2023-08-28 VITALS — BP 128/80 | Ht 64.0 in | Wt 151.0 lb

## 2023-08-28 DIAGNOSIS — K529 Noninfective gastroenteritis and colitis, unspecified: Secondary | ICD-10-CM

## 2023-08-28 LAB — CALPROTECTIN, FECAL: Calprotectin, Fecal: 136 ug/g — ABNORMAL HIGH (ref 0–120)

## 2023-08-28 NOTE — Patient Instructions (Signed)
 We have put I recall in for a Colonoscopy in 9 months.  _______________________________________________________  If your blood pressure at your visit was 140/90 or greater, please contact your primary care physician to follow up on this.  _______________________________________________________  If you are age 30 or older, your body mass index should be between 23-30. Your Body mass index is 25.92 kg/m. If this is out of the aforementioned range listed, please consider follow up with your Primary Care Provider.  If you are age 26 or younger, your body mass index should be between 19-25. Your Body mass index is 25.92 kg/m. If this is out of the aformentioned range listed, please consider follow up with your Primary Care Provider.   ________________________________________________________  The [facility] providers would like to encourage you to use MYCHART to communicate with providers for non-urgent requests or questions.  Due to long hold times on the telephone, sending your provider a message by [facility] may be a faster and more efficient way to get a response.  Please allow 48 business hours for a response.  Please remember that this is for non-urgent requests.  _______________________________________________________   It was a pleasure to see you today!  Thank you for trusting me with your gastrointestinal care!

## 2023-08-28 NOTE — Progress Notes (Signed)
 Results were reviewed with patient at her office appointment today.

## 2023-08-28 NOTE — Progress Notes (Signed)
 Discussed the use of AI scribe software for clinical note transcription with the patient, who gave verbal consent to proceed.  HPI : Rhonda Benton is a 28 year old nonbinary adult who presents for follow-up after recent colonoscopy.  She was initially referred to us  for painless rectal bleeding.  She saw Ellouise Console in our office April 15 and was scheduled for a colonoscopy.  The colonoscopy revealed normal-appearing colon with no polyps or findings to explain the bleeding, but multiple small ulcers were found in the terminal ileum. Biopsies indicated acute and chronic inflammation, possibly suggestive of early Crohn's disease. Inflammatory markers and B12 levels were normal, except for a slightly elevated fecal calprotectin level.  IBD serology testing was negative.  She continues to deny any symptoms suggestive of Crohn's disease such as abdominal pain, persistent diarrhea, nausea/vomiting or weight loss, other than mild GI upset during menses.  She has a strong family history of inflammatory bowel disease, with her mother has a history of aggressive Crohn's disease diagnosed at age 5, requiring surgical intervention and an ostomy bag since 75.  Her brother has fairly severe ulcerative colitis diagnosed at the age of 60.  Their diet is varied, focusing on solid protein, green vegetables, and sweet potatoes.   She denies any use of NSAIDs like Motrin for cramps in the past nine months, opting for heat therapy instead, although she admits to frequent NSAID use in the past   Colonoscopy - One 3 mm polyp in the sigmoid colon, removed with a cold snare. Resected and retrieved.  - Mild diverticulosis in the sigmoid colon.  - Multiple ulcers in the terminal ileum. Biopsied. Differential includes NSAID enteropathy vs. preclinical Crohn's disease.  - The distal rectum and anal verge are normal on retroflexion view.  - Patient's hematochezia most likely secondary to internal  hemorrhoids, although no prominent hemorrhoids seen on today's exam.  FINAL DIAGNOSIS       1. Surgical [P], small bowel, ileal ulcer :      - MILD ACUTE NONSPECIFIC CHRONIC ILEITIS      - SEE NOTE       2. Surgical [P], colon, sigmoid, polyp (1) :      - HYPERPLASTIC POLYP.      - NO DYSPLASIA OR MALIGNANCY.       Diagnosis Note : - The biopsy reveals features of early chronicity such as mild      architectural changes, basal lymphoplasmacytosis and minimal villous distention,      along with mild activity.No granulomas are identified.Diagnostic considerations      include infectious, drug and an emerging inflammatory bowel disease.Clinical and      endoscopic correlation is recommended.    Component Ref Range & Units (hover) 2 d ago  Calprotectin, Fecal 136 High   Comment: Concentration     Interpretation   Follow-Up < 5 - 50 ug/g     Normal           None >50 -120 ug/g     Borderline       Re-evaluate in 4-6 weeks     >120 ug/g     Abnormal         Repeat as clinically                                    indicated   Component Ref Range & Units (hover) 7 d  ago  gASCA 8  Comment:     Negative: <45  Equivocal: 45-50  Positive:  >50  ACCA 17  Comment:     Negative: <80  Equivocal: 80-90  Positive: >90  ALCA 4  Comment:     Negative:<55  Equivocal: 55-60 Positive: >60  AMCA 25  Comment:     Negative: <90  Equivocal:  90-100  Positive: >100     This test was developed and its performance     characteristics determined by [facility]. It has not     been cleared or approved by the Food and Drug     Administration. The FDA has determined that such     clearance or approval is not necessary.  Atypical pANCA Negative  Comments Comment  Comment: Pattern is not suggestive of Inflammatory Bowel Disease     Past Medical History:  Diagnosis Date   Alcoholism (HCC)    Anxiety    Attention deficit disorder (ADD)    Depression      Past Surgical History:  Procedure Laterality  Date   WISDOM TOOTH EXTRACTION     Family History  Problem Relation Age of Onset   High Cholesterol Mother    Crohn's disease Mother    Other Mother        iliostomy   High Cholesterol Father    Prostate cancer Father    Autoimmune disease Father        psoriatic arthritis   Colitis Brother    Heart disease Maternal Aunt    Heart disease Maternal Uncle    Breast cancer Maternal Grandmother    Kidney disease Maternal Grandfather    Skin cancer Maternal Grandfather    Hearing loss Paternal Grandmother    Heart attack Paternal Grandmother    Prostate cancer Paternal Grandfather    Diabetes Paternal Grandfather    Hearing loss Paternal Grandfather    Colon cancer Maternal Great-grandmother    Diabetes Other    Esophageal cancer Neg Hx    Rectal cancer Neg Hx    Stomach cancer Neg Hx    Social History   Tobacco Use   Smoking status: Former    Types: Cigarettes   Smokeless tobacco: Never  Vaping Use   Vaping status: Every Day  Substance Use Topics   Alcohol use: Not Currently   Drug use: Yes    Types: Marijuana   Current Outpatient Medications  Medication Sig Dispense Refill   amphetamine-dextroamphetamine (ADDERALL XR) 20 MG 24 hr capsule Take 20 mg by mouth daily as needed.     Multiple Vitamin (MULTIVITAMIN) capsule Take 1 capsule by mouth daily.     traZODone (DESYREL) 50 MG tablet Take 25-50 mg by mouth at bedtime as needed. (Patient not taking: Reported on 06/04/2023)     venlafaxine  XR (EFFEXOR -XR) 150 MG 24 hr capsule Take 1 capsule (150 mg total) by mouth daily.     No current facility-administered medications for this visit.   No Known Allergies   Review of Systems: All systems reviewed and negative except where noted in HPI.    No results found.  Physical Exam: BP 128/80   Ht 5' 4 (1.626 m)   Wt 151 lb (68.5 kg)   BMI 25.92 kg/m  Constitutional: Pleasant,well-developed, [name] in no acute distress.  Accompanied by  mother HEENT: Normocephalic and atraumatic. Conjunctivae are normal. No scleral icterus. Neck supple.  Cardiovascular: Normal rate, regular rhythm.  Pulmonary/chest: Effort normal and breath sounds normal. No  wheezing, rales or rhonchi. Abdominal: Soft, nondistended, nontender. Bowel sounds active throughout. There are no masses palpable. No hepatomegaly. Extremities: no edema Neurological: Alert and oriented to person place and time. Skin: Skin is warm and dry. No rashes noted. Psychiatric: Normal mood and affect. Behavior is normal.  CBC    Component Value Date/Time   WBC 8.3 01/15/2023 1102   RBC 4.52 01/15/2023 1102   HGB 12.7 01/15/2023 1102   HCT 39.3 01/15/2023 1102   PLT 232.0 01/15/2023 1102   MCV 86.9 01/15/2023 1102   MCHC 32.3 01/15/2023 1102   RDW 15.2 01/15/2023 1102   LYMPHSABS 1.7 01/15/2023 1102   MONOABS 0.7 01/15/2023 1102   EOSABS 0.1 01/15/2023 1102   BASOSABS 0.0 01/15/2023 1102    CMP     Component Value Date/Time   NA 138 01/15/2023 1102   K 4.5 01/15/2023 1102   CL 106 01/15/2023 1102   CO2 26 01/15/2023 1102   GLUCOSE 84 01/15/2023 1102   BUN 13 01/15/2023 1102   CREATININE 0.62 01/15/2023 1102   CALCIUM 9.4 01/15/2023 1102   PROT 6.9 01/15/2023 1102   ALBUMIN 4.3 01/15/2023 1102   AST 18 01/15/2023 1102   ALT 24 01/15/2023 1102   ALKPHOS 69 01/15/2023 1102   BILITOT 0.3 01/15/2023 1102       Latest Ref Rng & Units 01/15/2023   11:02 AM 10/17/2021    8:35 AM 04/20/2020    1:32 PM  CBC EXTENDED  WBC 4.0 - 10.5 K/uL 8.3  8.7  8.1   RBC 3.87 - 5.11 Mil/uL 4.52  4.49  4.13   Hemoglobin 12.0 - 15.0 g/dL 87.2  87.7  88.1   HCT 36.0 - 46.0 % 39.3  36.9  34.4   Platelets 150.0 - 400.0 K/uL 232.0  225.0  204.0   NEUT# 1.4 - 7.7 K/uL 5.8  6.3  5.7   Lymph# 0.7 - 4.0 K/uL 1.7  1.7  1.8       ASSESSMENT AND PLAN:  28 year old female who underwent colonoscopy to evaluate painless hematochezia, found to have multiple ileal ulcers, with  biopsies suggestive of probably Crohn's disease.  Fecal calprotectin mildly elevated, otherwise inflammatory markers normal, IBD serologies negative.  She does have a strong family history of inflammatory bowel disease. Although believe the patient most likely does have Crohn's disease, she is currently asymptomatic.  She does not have any features to suggest aggressive Crohn's disease (young age, smoking, high risk endoscopic features, anemia) that would warrant early aggressive treatment.  We discussed potential options to include observation, treatment with budesonide, or initiation of maintenance therapy.  I recommended observation, and the patient and her mother were in agreement with this plan. Will plan to repeat colonoscopy in 9 months to reassess disease activity.  Patient will contact our office if she experiences symptoms suggestive of active Crohn's disease.  Ileitis, likely preclinical Crohn's disease - Schedule repeat colonoscopy in 9 months. - Monitor for abdominal pain or diarrhea and report if they occur. - Consider budesonide and repeat calprotectin if symptoms develop. - Advise on healthy diet with adequate fiber, limit processed foods. - Continue to completely avoid NSAIDs  Recording duration: 12 minutes     Chigozie Basaldua E. Stacia, MD [facility]    Burns, Glade PARAS, MD

## 2023-08-29 ENCOUNTER — Ambulatory Visit: Payer: Self-pay | Admitting: Gastroenterology

## 2023-08-29 NOTE — Progress Notes (Signed)
 This result was discussed with the patient during her office visit.  Plan for repeat colonoscopy 9 months.
